# Patient Record
Sex: Male | Born: 1950 | Race: White | Hispanic: No | Marital: Married | Smoking: Never smoker
Health system: Southern US, Community
[De-identification: ages and names within clinical notes are randomized; demographics above are authoritative.]

## PROBLEM LIST (undated history)

## (undated) DIAGNOSIS — R29898 Other symptoms and signs involving the musculoskeletal system: Secondary | ICD-10-CM

## (undated) DIAGNOSIS — M4150 Other secondary scoliosis, site unspecified: Secondary | ICD-10-CM

## (undated) DIAGNOSIS — E039 Hypothyroidism, unspecified: Secondary | ICD-10-CM

## (undated) DIAGNOSIS — I10 Essential (primary) hypertension: Secondary | ICD-10-CM

## (undated) DIAGNOSIS — M2550 Pain in unspecified joint: Secondary | ICD-10-CM

## (undated) DIAGNOSIS — G629 Polyneuropathy, unspecified: Secondary | ICD-10-CM

## (undated) DIAGNOSIS — M5136 Other intervertebral disc degeneration, lumbar region: Secondary | ICD-10-CM

## (undated) DIAGNOSIS — M4180 Other forms of scoliosis, site unspecified: Secondary | ICD-10-CM

## (undated) DIAGNOSIS — I4891 Unspecified atrial fibrillation: Secondary | ICD-10-CM

## (undated) DIAGNOSIS — I499 Cardiac arrhythmia, unspecified: Secondary | ICD-10-CM

## (undated) DIAGNOSIS — M1990 Unspecified osteoarthritis, unspecified site: Secondary | ICD-10-CM

## (undated) DIAGNOSIS — M51369 Other intervertebral disc degeneration, lumbar region without mention of lumbar back pain or lower extremity pain: Secondary | ICD-10-CM

## (undated) DIAGNOSIS — M545 Low back pain, unspecified: Secondary | ICD-10-CM

## (undated) DIAGNOSIS — M5416 Radiculopathy, lumbar region: Secondary | ICD-10-CM

## (undated) DIAGNOSIS — M48062 Spinal stenosis, lumbar region with neurogenic claudication: Secondary | ICD-10-CM

## (undated) HISTORY — DX: Essential (primary) hypertension: I10

## (undated) HISTORY — DX: Pain in unspecified joint: M25.50

## (undated) HISTORY — DX: Other forms of scoliosis, site unspecified: M41.80

## (undated) HISTORY — DX: Polyneuropathy, unspecified: G62.9

## (undated) HISTORY — DX: Low back pain, unspecified: M54.50

## (undated) HISTORY — DX: Cardiac arrhythmia, unspecified: I49.9

## (undated) HISTORY — DX: Radiculopathy, lumbar region: M54.16

## (undated) HISTORY — PX: TONSILLECTOMY: SUR1361

## (undated) HISTORY — DX: Unspecified osteoarthritis, unspecified site: M19.90

## (undated) HISTORY — DX: Other intervertebral disc degeneration, lumbar region without mention of lumbar back pain or lower extremity pain: M51.369

## (undated) HISTORY — DX: Unspecified atrial fibrillation: I48.91

## (undated) HISTORY — DX: Other secondary scoliosis, site unspecified: M41.50

## (undated) HISTORY — DX: Other symptoms and signs involving the musculoskeletal system: R29.898

## (undated) HISTORY — DX: Other intervertebral disc degeneration, lumbar region: M51.36

---
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
## 2011-05-11 HISTORY — PX: FRACTURE SURGERY: SHX138

## 2012-05-10 HISTORY — PX: ANKLE SURGERY: SHX546

## 2017-05-10 HISTORY — PX: ABLATION: SHX5711

## 2018-01-16 DIAGNOSIS — M48062 Spinal stenosis, lumbar region with neurogenic claudication: Secondary | ICD-10-CM

## 2018-01-16 HISTORY — DX: Spinal stenosis, lumbar region with neurogenic claudication: M48.062

## 2018-05-02 HISTORY — PX: CARDIOVERSION: SHX1299

## 2018-11-01 ENCOUNTER — Telehealth: Payer: Self-pay

## 2018-11-01 NOTE — Telephone Encounter (Signed)
LVM to confirm Nerve conduction study

## 2018-11-02 ENCOUNTER — Encounter (INDEPENDENT_AMBULATORY_CARE_PROVIDER_SITE_OTHER): Payer: Medicare Other | Admitting: Diagnostic Neuroimaging

## 2018-11-02 ENCOUNTER — Ambulatory Visit (INDEPENDENT_AMBULATORY_CARE_PROVIDER_SITE_OTHER): Payer: Medicare Other | Admitting: Diagnostic Neuroimaging

## 2018-11-02 ENCOUNTER — Other Ambulatory Visit: Payer: Self-pay

## 2018-11-02 DIAGNOSIS — R200 Anesthesia of skin: Secondary | ICD-10-CM | POA: Diagnosis not present

## 2018-11-02 DIAGNOSIS — Z0289 Encounter for other administrative examinations: Secondary | ICD-10-CM

## 2018-11-09 NOTE — Procedures (Signed)
[facility]  NCS (NERVE CONDUCTION STUDY) WITH EMG (ELECTROMYOGRAPHY) REPORT   STUDY DATE: 11/02/18 PATIENT NAME: Cameron RoachMichael Lose DOB: 12/01/1950 MRN: [id number]  ORDERING CLINICIAN: Sheela Stack Brooks  TECHNOLOGIST: Durenda AgeN Gann ELECTROMYOGRAPHER: Glenford BayleyVikram R. Tanisia Yokley, MD  CLINICAL INFORMATION: 68 year old male with lower extremity numbness.  FINDINGS: NERVE CONDUCTION STUDY:  Bilateral peroneal and tibial motor responses of prolonged distal latencies, decreased amplitudes, slow conduction velocities.  Bilateral superficial peroneal and left sural sensory responses could not be obtained.  Right sural sensory response has normal peak latency and decreased amplitude.  Bilateral tibial F wave latencies are prolonged greater than 76 ms.  Right tibial H reflex cannot be obtained.  Left tibial H reflex is normal.   NEEDLE ELECTROMYOGRAPHY:  Needle examination of right upper and lower extremities is normal except for positive sharp waves in the right gastrocnemius at rest.  Normal motor unit recruitment on exertion.   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   - May consider further demyelinating neuropathy workup for immune neuropathies (CIDP, anti-sulfatide, anti-MAG, anti-GM1, monoclonal gammopathy), hereditary neuropathies (CMT, HMSN), infectious neuropathies (HIV) or metabolic causes (diabetes, hypothyroidism, liver disease).    INTERPRETING PHYSICIAN:  Suanne MarkerVIKRAM R. Remee Charley, MD Certified in Neurology, Neurophysiology and Neuroimaging  [facility] [address], [address] [place], [place] [address] [phone]  [facility]    Nerve / Sites Muscle Latency Ref. Amplitude Ref. Rel Amp Segments Distance Velocity Ref. Area    ms ms mV mV %  cm m/s m/s mVms  R Peroneal - EDB     Ankle EDB 6.9 ?6.5 1.4 ?2.0 100 Ankle - EDB 9   4.0     Fib head EDB 15.7  1.4   99.1 Fib head - Ankle 32 36 ?44 3.9     Pop fossa EDB 19.9  0.8  54.9 Pop fossa - Fib head 10 24 ?44 1.5         Pop fossa - Ankle      L Peroneal - EDB     Ankle EDB 6.8 ?6.5 0.8 ?2.0 100 Ankle - EDB 9   2.6     Fib head EDB 16.3  0.9  121 Fib head - Ankle 30 32 ?44 2.8     Pop fossa EDB 19.3  1.0  111 Pop fossa - Fib head 10 33 ?44 2.8         Pop fossa - Ankle      R Tibial - AH     Ankle AH 6.3 ?5.8 3.9 ?4.0 100 Ankle - AH 9   10.7     Pop fossa AH 19.2  1.9  48.6 Pop fossa - Ankle 38 30 ?41 6.6  L Tibial - AH     Ankle AH 6.3 ?5.8 2.3 ?4.0 100 Ankle - AH 9   6.0     Pop fossa AH 18.4  2.0  87.1 Pop fossa - Ankle 40 33 ?41 5.9             SNC    Nerve / Sites Rec. Site Peak Lat Ref.  Amp Ref. Segments Distance    ms ms V V  cm  R Sural - Ankle (Calf)     Calf Ankle 4.4 ?4.4 4 ?6 Calf - Ankle 14  L Sural - Ankle (Calf)     Calf Ankle NR ?4.4 NR ?6 Calf - Ankle 14  R Superficial peroneal -  Ankle     Lat leg Ankle NR ?4.4 NR ?6 Lat leg - Ankle 14  L Superficial peroneal - Ankle     Lat leg Ankle NR ?4.4 NR ?6 Lat leg - Ankle 14              F  Wave    Nerve F Lat Ref.   ms ms  R Tibial - AH 76.5 ?56.0  L Tibial - AH 77.6 ?56.0         H Reflex    Nerve H Lat   ms   Left Right Ref.  Tibial - [place] 22.1 NR ?35.0         EMG full       EMG Summary Table    Spontaneous MUAP Recruitment  Muscle IA Fib PSW Fasc Other Amp Dur. Poly Pattern  R. Tibialis anterior Increased None None None _______ Increased Normal Normal Discrete  R. Gastrocnemius (Medial head) Normal None 1+ None _______ Normal Normal Normal Normal  R. Vastus medialis Normal None None None _______ Normal Normal Normal Normal  R. Deltoid Normal None None None _______ Normal Normal Normal Normal  R. Biceps brachii Normal None None None _______ Normal Normal Normal Normal  R. Triceps brachii Normal None None None _______ Normal Normal Normal Normal  R. Flexor carpi radialis Normal None None None _______  Normal Normal Normal Normal  R. First dorsal interosseous Normal None None None _______ Normal Normal Normal Normal

## 2019-01-10 ENCOUNTER — Encounter: Payer: Self-pay | Admitting: *Deleted

## 2019-01-10 ENCOUNTER — Other Ambulatory Visit: Payer: Self-pay | Admitting: *Deleted

## 2019-01-16 ENCOUNTER — Other Ambulatory Visit: Payer: Self-pay

## 2019-01-16 ENCOUNTER — Ambulatory Visit (INDEPENDENT_AMBULATORY_CARE_PROVIDER_SITE_OTHER): Payer: Medicare Other | Admitting: Diagnostic Neuroimaging

## 2019-01-16 ENCOUNTER — Telehealth: Payer: Self-pay | Admitting: *Deleted

## 2019-01-16 ENCOUNTER — Encounter: Payer: Self-pay | Admitting: Diagnostic Neuroimaging

## 2019-01-16 VITALS — BP 124/82 | HR 68 | Temp 98.2°F | Ht 71.0 in | Wt 264.8 lb

## 2019-01-16 DIAGNOSIS — G629 Polyneuropathy, unspecified: Secondary | ICD-10-CM

## 2019-01-16 NOTE — Progress Notes (Signed)
[facility]  PATIENT: Cameron Curry DOB: August 16, 1950  REFERRING CLINICIAN: Rolena Infante HISTORY FROM: patient  REASON FOR VISIT: new consult    HISTORICAL  CHIEF COMPLAINT:  Chief Complaint  Patient presents with  . Peripheral Neuropathy    rm 6 New Pt , "numbness, tingling in toes/feet/legs, more so in back of legs"    HISTORY OF PRESENT ILLNESS:   68 year old male here for evaluation of neuropathy.  For past 2 years patient has had onset of low back pain radiating to his legs, worse when he stands up straight.  Symptoms slightly alleviated when he bends forward or sits down.  Patient's had MRI of the lumbar spine showing significant degenerative spine disease, multilevel moderate to severe spinal stenosis and foraminal stenosis.  Patient was referred here for EMG nerve conduction study which demonstrated a sensorimotor polyneuropathy with axonal and demyelinating features.  Therefore patient referred back today for further characterization of etiology of neuropathy.  Symptoms are bilateral and symmetric in the lower extremities.  His main problem is back pain radiating down his back of his legs.  He also has numbness and tingling in the toes and feet.  No problems with his fingers or hands, neck or arms.  No family history of neuropathy.   REVIEW OF SYSTEMS: Full 14 system review of systems performed and negative with exception of: As per HPI.  ALLERGIES: No Known Allergies  HOME MEDICATIONS: Outpatient Medications Prior to Visit  Medication Sig Dispense Refill  . aspirin EC 325 MG tablet Take by mouth daily. 01/16/19 taking 1/2 tab daily    . lisinopril (ZESTRIL) 10 MG tablet lisinopril 10 mg tablet  TK 1 T PO QD    . metoprolol succinate (TOPROL-XL) 50 MG 24 hr tablet metoprolol succinate ER 50 mg tablet,extended release 24 hr    . albuterol (VENTOLIN HFA) 108 (90 Base) MCG/ACT inhaler albuterol sulfate HFA 90 mcg/actuation aerosol inhaler    . amiodarone  (PACERONE) 200 MG tablet 200 mg.    . benzonatate (TESSALON) 100 MG capsule benzonatate 100 mg capsule  TAKE 1 CAPSULE BY MOUTH THREE TIMES A DAY AS NEEDED    . flecainide (TAMBOCOR) 100 MG tablet flecainide 100 mg tablet  TK 1 T PO Q 12 H    . pantoprazole (PROTONIX) 40 MG tablet pantoprazole 40 mg tablet,delayed release    . rivaroxaban (XARELTO) 20 MG TABS tablet 20 mg.    . sotalol (BETAPACE) 80 MG tablet 80 mg.     No facility-administered medications prior to visit.     PAST MEDICAL HISTORY: Past Medical History:  Diagnosis Date  . Arthritis   . Atrial fibrillation ([place])    ablation  . DDD (degenerative disc disease), lumbar   . Low back pain   . Neurogenic claudication due to lumbar spinal stenosis   . Peripheral nerve disease     PAST SURGICAL HISTORY: Past Surgical History:  Procedure Laterality Date  . ANKLE SURGERY  2014   ORIF    FAMILY HISTORY: Family History  Problem Relation Age of Onset  . Cancer Mother        ovarian  . Heart disease Father   . Atrial fibrillation Brother     SOCIAL HISTORY: Social History   Socioeconomic History  . Marital status: Married    Spouse name: Not on file  . Number of children: 0  . Years of education: Not on file  . Highest education level: Some college, no degree  Occupational History  Comment: retired  Scientific laboratory technician  . Financial resource strain: Not on file  . Food insecurity    Worry: Not on file    Inability: Not on file  . Transportation needs    Medical: Not on file    Non-medical: Not on file  Tobacco Use  . Smoking status: Never Smoker  . Smokeless tobacco: Never Used  Substance and Sexual Activity  . Alcohol use: Yes    Comment: "moderate"  . Drug use: Never  . Sexual activity: Not on file  Lifestyle  . Physical activity    Days per week: Not on file    Minutes per session: Not on file  . Stress: Not on file  Relationships  . Social Herbalist on phone: Not on file    Gets  together: Not on file    Attends religious service: Not on file    Active member of club or organization: Not on file    Attends meetings of clubs or organizations: Not on file    Relationship status: Not on file  . Intimate partner violence    Fear of current or ex partner: Not on file    Emotionally abused: Not on file    Physically abused: Not on file    Forced sexual activity: Not on file  Other Topics Concern  . Not on file  Social History Narrative   Lives with wife   Caffeine- coffee, 3 cups daily     PHYSICAL EXAM  GENERAL EXAM/CONSTITUTIONAL: Vitals:  Vitals:   01/16/19 1437  BP: 124/82  Pulse: 68  Temp: 98.2 F (36.8 C)  Weight: 264 lb 12.8 oz (120.1 kg)  Height: 5' 11"  (1.803 m)   Body mass index is 36.93 kg/m. Wt Readings from Last 3 Encounters:  01/16/19 264 lb 12.8 oz (120.1 kg)    Patient is in no distress; well developed, nourished and groomed; neck is supple  CARDIOVASCULAR:  Examination of carotid arteries is normal; no carotid bruits  Regular rate and rhythm, no murmurs  Examination of peripheral vascular system by observation and palpation is normal  EYES:  Ophthalmoscopic exam of optic discs and posterior segments is normal; no papilledema or hemorrhages No exam data present  MUSCULOSKELETAL:  Gait, strength, tone, movements noted in Neurologic exam below  NEUROLOGIC: MENTAL STATUS:  No flowsheet data found.  awake, alert, oriented to person, place and time  recent and remote memory intact  normal attention and concentration  language fluent, comprehension intact, naming intact  fund of knowledge appropriate  CRANIAL NERVE:   2nd - no papilledema on fundoscopic exam  2nd, 3rd, 4th, 6th - pupils equal and reactive to light, visual fields full to confrontation, extraocular muscles intact, no nystagmus  5th - facial sensation symmetric  7th - facial strength symmetric  8th - hearing intact  9th - palate elevates  symmetrically, uvula midline  11th - shoulder shrug symmetric  12th - tongue protrusion midline  MOTOR:   normal bulk and tone, full strength in the BUE, BLE  SENSORY:   normal and symmetric to light touch, temperature, vibration; SLIGHTLY DECR IN ANKLES  COORDINATION:   finger-nose-finger, fine finger movements normal  REFLEXES:   deep tendon reflexes --> BUE 1; BLE delayed exaggerated reaction 2-3+ in legs; inconsistent  GAIT/STATION:   narrow based gait     DIAGNOSTIC DATA (LABS, IMAGING, TESTING) - I reviewed patient records, labs, notes, testing and imaging myself where available.  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  - May consider further demyelinating neuropathy workup for immune neuropathies (CIDP, anti-sulfatide, anti-MAG, anti-GM1, monoclonal gammopathy), hereditary neuropathies (CMT, HMSN), infectious neuropathies (HIV) or metabolic causes (diabetes, hypothyroidism, liver disease).   ASSESSMENT AND PLAN  68 y.o. year old male here with: New onset numbness, pain, tingling in lower extremities, associated back pain radiating down the legs, and significant degenerative spine disease in the lumbar region.  Dx:  1. Neuropathy      PLAN:  NEUROPATHY (demyelinating features; autoimmune, inflamm, hereditary) - check labs - check LP  LUMBAR SPINAL STENOSIS (symptomatic) - follow up per Dr. Rolena Infante  Orders Placed This Encounter   Procedures  . DG FL GUIDED LUMBAR PUNCTURE  . GM1 IgG Autoantibodies  . GM1 IgM Autoantibodies  . RPR  . HIV antibody (with reflex)  . Multiple Myeloma Panel (SPEP&IFE w/QIG)  . Lyme Ab/Western Blot Reflex   Return pending test results, for pending if symptoms worsen or fail to improve.    Penni Bombard, MD 10/12/4648, 3:54 PM Certified in Neurology, Neurophysiology and Neuroimaging  [facility] [address], [place], [place] [address] [phone]

## 2019-01-16 NOTE — Telephone Encounter (Signed)
Faxed [facility] lab orders re: trsts # [id number], [phone].

## 2019-01-24 LAB — LYME AB/WESTERN BLOT REFLEX
LYME DISEASE AB, QUANT, IGM: <0.8 index (ref 0.00–0.79)
Lyme IgG/IgM Ab: 2.63 {ISR} — ABNORMAL HIGH (ref 0.00–0.90)

## 2019-01-24 LAB — MULTIPLE MYELOMA PANEL, SERUM
Albumin SerPl Elph-Mcnc: 4 g/dL (ref 2.9–4.4)
Albumin/Glob SerPl: 1.5 (ref 0.7–1.7)
Alpha 1: 0.2 g/dL (ref 0.0–0.4)
Alpha2 Glob SerPl Elph-Mcnc: 0.8 g/dL (ref 0.4–1.0)
B-Globulin SerPl Elph-Mcnc: 0.9 g/dL (ref 0.7–1.3)
Gamma Glob SerPl Elph-Mcnc: 0.9 g/dL (ref 0.4–1.8)
Globulin, Total: 2.8 g/dL (ref 2.2–3.9)
IgA/Immunoglobulin A, Serum: 281 mg/dL (ref 61–437)
IgG (Immunoglobin G), Serum: 928 mg/dL (ref 603–1613)
IgM (Immunoglobulin M), Srm: 57 mg/dL (ref 20–172)
Total Protein: 6.8 g/dL (ref 6.0–8.5)

## 2019-01-24 LAB — GM1 IGG AUTOANTIBODIES: GM1 IgG Autoantibodies: 5 % (ref 0–30)

## 2019-01-24 LAB — LYME, WESTERN BLOT, SERUM (REFLEXED)
IgG P18 Ab.: ABSENT
IgG P23 Ab.: ABSENT
IgG P28 Ab.: ABSENT
IgG P39 Ab.: ABSENT
IgG P58 Ab.: ABSENT
IgG P66 Ab.: ABSENT
IgG P93 Ab.: ABSENT
IgM P23 Ab.: ABSENT
IgM P39 Ab.: ABSENT
IgM P41 Ab.: ABSENT
Lyme IgG Wb: NEGATIVE
Lyme IgM Wb: NEGATIVE

## 2019-01-24 LAB — RPR: RPR Ser Ql: NONREACTIVE

## 2019-01-24 LAB — INTERPRETATION

## 2019-01-24 LAB — HIV ANTIBODY (ROUTINE TESTING W REFLEX): HIV Screen 4th Generation wRfx: NONREACTIVE

## 2019-01-24 LAB — GM1 IGM AUTOANTIBODIES: GM1 IgM Autoantibodies: <5 % (ref 0–30)

## 2019-01-30 NOTE — Telephone Encounter (Signed)
Received fax from [facility]: lab order is on hold. [facility] waiting on patient to sign ABN which is required by Medicare. They will cancel test if no information received in 30 days, but testing can be resumed if required information is provided. Placed on Dr [employer], Juluis Rainier.

## 2019-01-31 ENCOUNTER — Telehealth: Payer: Self-pay | Admitting: *Deleted

## 2019-01-31 NOTE — Telephone Encounter (Signed)
LVM informing patient his labs are unremarkable labs. The Lyme antibody was positive, but western blot confirmation was negative (therefore it was false positive). All other labs are ok. Continue current plan of LP. I advised I noted he knew to call to schedule LP. Left number for questions.

## 2019-01-31 NOTE — Telephone Encounter (Signed)
Follow up LP. -VRP

## 2019-04-25 ENCOUNTER — Other Ambulatory Visit: Payer: Self-pay | Admitting: *Deleted

## 2019-04-27 ENCOUNTER — Ambulatory Visit: Payer: Self-pay | Admitting: Orthopedic Surgery

## 2019-05-11 HISTORY — PX: HERNIA REPAIR: SHX51

## 2019-05-28 ENCOUNTER — Telehealth (HOSPITAL_COMMUNITY): Payer: Self-pay

## 2019-05-28 NOTE — Telephone Encounter (Signed)

## 2019-05-29 ENCOUNTER — Ambulatory Visit (INDEPENDENT_AMBULATORY_CARE_PROVIDER_SITE_OTHER): Payer: Medicare Other | Admitting: Vascular Surgery

## 2019-05-29 ENCOUNTER — Other Ambulatory Visit: Payer: Self-pay

## 2019-05-29 ENCOUNTER — Encounter: Payer: Self-pay | Admitting: Vascular Surgery

## 2019-05-29 VITALS — BP 141/85 | HR 80 | Temp 97.3°F | Resp 20 | Ht 71.0 in | Wt 268.0 lb

## 2019-05-29 DIAGNOSIS — M5136 Other intervertebral disc degeneration, lumbar region: Secondary | ICD-10-CM

## 2019-05-29 NOTE — Progress Notes (Signed)
[facility]  Patient name: Cameron Curry MRN: [id number] DOB: 02/12/1951 Sex: male  REASON FOR CONSULT: Discuss oblique exposure for L4-5 disc surgery with Dr. Shon Baton  HPI: Cameron Curry is a 69 y.o. male, who is here today for discussion of oblique exposure for L4-5 disc surgery. He is complaining of severe back pain and bilateral lower extremity weakness. He has had an extensive work-up and is felt this is due to neurogenic claudication from spinal stenosis. He is scheduled for oblique approach to L4-5 with Dr. Shon Baton. He has had no prior intra-abdominal surgery. He is very active and did a great deal of [place] fitness exercise and muscle building over his lifetime. No history of cardiac ischemic disease. Does have a history of ablation for atrial fibrillation. Does have diastases rectus  Past Medical History:  Diagnosis Date  . Arthritis   . Atrial fibrillation ([facility])    ablation  . Bilateral leg weakness   . DDD (degenerative disc disease), lumbar   . Degenerative scoliosis   . Hypertensive disorder   . Irregular heartbeat   . Joint pain   . Low back pain   . Lumbar radiculopathy   . Neurogenic claudication due to lumbar spinal stenosis 01/16/2018   Of lumbar region.  . Peripheral nerve disease     Family History  Problem Relation Age of Onset  . Cancer Mother        ovarian  . Heart disease Mother   . Heart disease Father   . Atrial fibrillation Brother     SOCIAL HISTORY: Social History   Socioeconomic History  . Marital status: Married    Spouse name: Not on file  . Number of children: 0  . Years of education: Not on file  . Highest education level: Some college, no degree  Occupational History    Comment: retired  Tobacco Use  . Smoking status: Never Smoker  . Smokeless tobacco: Never Used  Substance and Sexual Activity  . Alcohol use: Yes    Comment: "moderate"  . Drug use: Never  .  Sexual activity: Not on file  Other Topics Concern  . Not on file  Social History Narrative   Lives with wife   Caffeine- coffee, 3 cups daily   Social Determinants of Health   Financial Resource Strain:   . Difficulty of Paying Living Expenses: Not on file  Food Insecurity:   . Worried About Programme researcher, broadcasting/film/video in the Last Year: Not on file  . Ran Out of Food in the Last Year: Not on file  Transportation Needs:   . Lack of Transportation (Medical): Not on file  . Lack of Transportation (Non-Medical): Not on file  Physical Activity:   . Days of Exercise per Week: Not on file  . Minutes of Exercise per Session: Not on file  Stress:   . Feeling of Stress : Not on file  Social Connections:   . Frequency of Communication with Friends and Family: Not on file  . Frequency of Social Gatherings with Friends and Family: Not on file  . Attends Religious Services: Not on file  . Active Member of Clubs or Organizations: Not on file  . Attends Banker Meetings: Not on file  . Marital Status: Not on file  Intimate Partner Violence:   . Fear of Current or Ex-Partner: Not on file  . Emotionally Abused: Not on file  . Physically Abused: Not on file  . Sexually  Abused: Not on file    No Known Allergies  Current Outpatient Medications  Medication Sig Dispense Refill  . aspirin EC 81 MG tablet Take 81 mg by mouth daily.    Marland Kitchen levothyroxine (SYNTHROID) 25 MCG tablet levothyroxine 25 mcg tablet    . lisinopril (ZESTRIL) 10 MG tablet lisinopril 10 mg tablet  TK 1 T PO QD    . metoprolol succinate (TOPROL-XL) 50 MG 24 hr tablet metoprolol succinate ER 50 mg tablet,extended release 24 hr     No current facility-administered medications for this visit.    REVIEW OF SYSTEMS:  [X]  denotes positive finding, [ ]  denotes negative finding Cardiac  Comments:  Chest pain or chest pressure:    Shortness of breath upon exertion:    Short of breath when lying flat:    Irregular heart  rhythm:        Vascular    Pain in calf, thigh, or hip brought on by ambulation: x  neurogenic  Pain in feet at night that wakes you up from your sleep:  x   Blood clot in your veins:    Leg swelling:         Pulmonary    Oxygen at home:    Productive cough:     Wheezing:         Neurologic    Sudden weakness in arms or legs:  x   Sudden numbness in arms or legs:  x   Sudden onset of difficulty speaking or slurred speech:    Temporary loss of vision in one eye:     Problems with dizziness:         Gastrointestinal    Blood in stool:     Vomited blood:         Genitourinary    Burning when urinating:     Blood in urine:        Psychiatric    Major depression:         Hematologic    Bleeding problems:    Problems with blood clotting too easily:        Skin    Rashes or ulcers:        Constitutional    Fever or chills:      PHYSICAL EXAM: Vitals:   05/29/19 1003  BP: (!) 141/85  Pulse: 80  Resp: 20  Temp: (!) 97.3 F (36.3 C)  SpO2: 95%  Weight: 268 lb (121.6 kg)  Height: 5\' 11"  (1.803 m)    GENERAL: The patient is a well-nourished male, in no acute distress. The vital signs are documented above. CARDIOVASCULAR: Carotid arteries without bruits bilaterally. 2+ radial and 2+ dorsalis pedis pulses bilaterally. PULMONARY: There is good air exchange  ABDOMEN: Soft and non-tender  MUSCULOSKELETAL: There are no major deformities or cyanosis. NEUROLOGIC: No focal weakness or paresthesias are detected. SKIN: There are no ulcers or rashes noted. PSYCHIATRIC: The patient has a normal affect.  DATA:  I have his plain films of his lumbar spine and also MRI. His plain films show no evidence of atherosclerotic change. His MRI shows normal location of his aortic and iliac bifurcation  MEDICAL ISSUES: Had a long discussion with the patient regarding my role for exposure. Explained that Dr. Rolena Infante feels that he would benefit from spine surgery from an oblique approach.  He has multiple questions regarding the success of this procedure and outcomes. I have deferred this to Dr. Rolena Infante. I do not feel that he has any  contraindications to oblique approach. He is not morbidly obese. He is a big man. He has had no prior abdominal surgery and his films show no evidence of aortoiliac calcification. He reports that he has severe back pain and his leg weakness is caused him to fall on several occasions. We will proceed with surgery as indicated   Larina Earthly, MD [facility] Tel [phone] Pager [phone]

## 2019-06-04 ENCOUNTER — Ambulatory Visit: Payer: Self-pay | Admitting: Orthopedic Surgery

## 2019-06-04 NOTE — H&P (Signed)
Subjective:   Cameron Curry is a pleasant 69 year old male His long-standing low back pain and bilateral radicular leg pain due to spinal stenosis at multiple levels but has failed to improve with conservative treatment including time, injection therapy, medications. Since the last time we saw him on 04/20/2019, he has had many more falls due to his legs giving out on him indicating progressive neurological deficits. He has elected to move forward with surgical intervention. He is scheduled for 06/13/19 and 06/14/19= OLIF L4-5/ XLIF L2-4/ PSF L2-5 at [facility].  Patient recently had an uncomplicated course of COVID-19 and November. Since then he has seen his primary care provider. He has not seen his cardiologist since October 2020.  Past Medical History:  Diagnosis Date  . Arthritis   . Atrial fibrillation (HCC)    ablation  . Bilateral leg weakness   . DDD (degenerative disc disease), lumbar   . Degenerative scoliosis   . Hypertensive disorder   . Irregular heartbeat   . Joint pain   . Low back pain   . Lumbar radiculopathy   . Neurogenic claudication due to lumbar spinal stenosis 01/16/2018   Of lumbar region.  . Peripheral nerve disease     Past Surgical History:  Procedure Laterality Date  . ANKLE SURGERY  2014   ORIF    Current Outpatient Medications  Medication Sig Dispense Refill Last Dose  . aspirin EC 81 MG tablet Take 81 mg by mouth daily.     Marland Kitchen levothyroxine (SYNTHROID) 50 MCG tablet Take 50 mcg by mouth daily before breakfast.      . lisinopril (ZESTRIL) 10 MG tablet Take 10 mg by mouth daily.      . metoprolol succinate (TOPROL-XL) 25 MG 24 hr tablet Take 25 mg by mouth daily.      . Multiple Vitamin (MULTIVITAMIN WITH MINERALS) TABS tablet Take 1 tablet by mouth daily.      No current facility-administered medications for this visit.   No Known Allergies  Social History   Tobacco Use  . Smoking status: Never Smoker  . Smokeless tobacco: Never Used  Substance Use Topics  .  Alcohol use: Yes    Comment: "moderate"    Family History  Problem Relation Age of Onset  . Cancer Mother        ovarian  . Heart disease Mother   . Heart disease Father   . Atrial fibrillation Brother     Review of Systems As stated in HPI  Objective:   Vitals: Ht: 5 ft 10 in 06/04/2019 09:55 am Wt: 267 lbs 06/04/2019 10:05 am BMI: 38.3 06/04/2019 10:05 am BP: 188/108 06/04/2019 10:05 am Pulse: 74 bpm 06/04/2019 10:05 am T: 97.8 F 06/04/2019 10:05 am   General: Patient is alert and oriented 3, no apparent distress.  Psych: Normal mood and affect  Ambulation: Antalgic, slightly altered due to Subjective lower extremity weakness. Reports 3 falls due to leg giving out since last visit.  Heart: Regular rate and rhythm, no rubs, murmurs, or gallops  Lungs: Clear to auscultation bilaterally  Abdomen: Bowel sounds 4, nondistended, nontender, no hepatosplenomegaly.  Neuro: Continues to have 5/5 lower extremity motor strength bilaterally with the exception of bilateral gastroc weakness but seems to be secondary to pain rather than true motor deficit. Positive dysesthesias L2, L3 and to a lesser degree L4 dermatome. Negative Babinski test, no clonus.  Updated MRI of the lumbar spine performed On 04/03/2019 there has been no significant change noted. Severe disc bulging osteophyte  complex asymmetric to the left. Moderate to severe narrowing of the thecal sac which is progressed secondary to epidural lipomatosis. Moderate to severe left foraminal narrowing. L3-4: Again moderate hard disc osteophyte at the disc space level. There is a small left anterior medially directed synovial cyst and severe right facet arthrosis. Moderate to severe left lateral recess stenosis causing compression of left L4 nerve root. L4-5: Severe hard disc osteophyte asymmetric to the right with severe right disc space loss and foraminal stenosis. Chronic wedge fracture at L2. Mild retrolisthesis L1 on  L2.  Assessment:   Cameron Curry is a pleasant 69 year old male Past medical history significant for morbid obesity A. fib (on aspirin) Who is suffering from low back pain and bilateral radicular leg pain that has been rather severe, debilitating, and progressive. He is now having several falls due to his legs giving out. He would like to move forward with surgical intervention.  Plan:   Surgical plan: The first day I would do an oblique lumbar interbody fusion at L4-5 and lateral interbody fusions L2-3 and L 3 4. The second day I would do posterior supplemental pedicle screw fixation L2-L5.   We have received vascular clearance from Dr. Donnetta Hutching.   I have personally tried to contact The primary care provider as the patient is quite hypertensive at today's visit. I have left a callback number of the provider to call me. Patient states he has taken his medication for blood pressure and that it has been well managed at home. I have informed the patient that he must see his primary care provider for reevaluation and blood pressure check prior to moving forward with surgical intervention. Patient states he has seen this provider to times since being diagnosed with COVID.   I have personally called and spoken with Dr. Christel Mormon the patient's cardiologist who states he was unaware that the patient needed cardio clearance/risk stratification (which we do NOT have), he thought the question was just iadvice as to what to do for his blood thinner medication. Documentation we have from Dr. Christel Mormon previously said the patient may hold his Xarelto 48 hours preoperatively, we then responded by asking if it was okay to hold the Xarelto 5 days preoperatively at which point it was discovered the patient is not taking Xarelto, but that he is taking aspirin 325 mg. It does appear that the cardiologist is aware That the patient is not taking Xarelto, although it appears xarelto is/was the recommendation. Additionally, patient is taking  aspirin, Per cardiologist he should be on 325 mg daily, however patient reports taking 81 mg daily. I did inform the cardiologist that we can fax any surgical clearance on 04/25/2019, but he states that his staff must of interpreted this and not made him aware, but rather just asked him about the blood in her medication. I informed Dr. Christel Mormon that the primary care Provider indicated on their clearance form that the patient needs cardiac clearance; additionally, we would prefer that patient have cardiac clearance as well. Therefore, Dr. Christel Mormon states he will try and get the patient in for a cardiac evaluation and risk stratification/clearance prior to his scheduled surgery next week. Patient's last visit with the cardiologist was in October 2020, before being diagnosed with COVID. Prior to moving forward with surgical intervention, the patient must be seen and evaluated by cardiologist and deemed an acceptable risk and optimized for surgery.   After reaching out to both the patient's primary care provider as well as his cardiologist, I attempted  to contact the patient and left a message on his call back so that I could inform him that he must be seen and evaluated by his primary care, who had not been able to get a hold of, as well as his cardiologist whose office should be contacting him to schedule an appointment.   We did discuss risks of surgery including:   Risks and benefits of surgery were discussed with the patient. These include: Infection, bleeding, death, stroke, paralysis, ongoing or worse pain, need for additional surgery, nonunion, leak of spinal fluid, adjacent segment degeneration requiring additional fusion surgery, need for posterior decompression and/or fusion. Bleeding from major vessels, and blood clots (deep venous thrombosis)requiring additional treatment. Due to the abdominal contents requiring further intervention, loss in bowel and bladder control.  Additional risk for male  patients: Retrograde ejaculation, and therefore infertility.   Risks, benefits of surgery were reviewed with the patient. These include: infection, bleeding, death, stroke, paralysis, ongoing or worse pain, need for additional surgery, injury to the lumbar plexus resulting in hip flexor weakness and difficulty walking without assistive devices. Adjacent segment degenerative disease, need for additional surgery including fusing other levels, leak of spinal fluid, Nonunion, hardware failure, breakage, or mal-position. Deep venous thrombosis (DVT) requiring additional treatment such as filter, and/or medications. Injury to abdominal contents, loss in bowel and bladder control.   We have also discussed the goals of surgery to include:   Goals of surgery: Reduction in pain, and improvement in quality of life.   We have also discussed the post-operative recovery period to include: bathing/showering restrictions, wound healing, activity (and driving) restrictions, medications/pain mangement.   We have also discussed post-operative redflags to include: signs and symptoms of postoperative infection, DVT/PE.   I did review the patient's medication list with him today. He is taking aspirin 81 mg which he knows he needs to hold 7 days prior to surgical intervention. I also informed him that he should not take any anti-inflammatory medications leading up to surgery which he did express understanding of.   Patient is scheduled for preoperative testing at [facility] which will include a chest x-ray. Given current Covid protocol, he should not be retested as a positive test within the last 3 months.   Patient was fitted for his LSO brace by physical therapy today.   Plan is to move forward with the surgery as scheduled if the patient is able to get in with his PCP as well as cardiologist and cleared as well as pending preoperative testing at [facility].   Follow-up: 2 weeks  postoperatively

## 2019-06-08 ENCOUNTER — Encounter (HOSPITAL_COMMUNITY): Payer: Self-pay

## 2019-06-08 NOTE — Progress Notes (Signed)
[facility] [phone] - [place], [place] - [address] AT [address] [place] [address] Phone: [phone] Fax: [phone]      Your procedure is scheduled on Wednesday, February 3rd, 2021.  Report to [facility] Main Entrance "A" at 6:30 A.M., and check in at the Admitting office.  Call this number if you have problems the morning of surgery:  [phone]  Call [phone] if you have any questions prior to your surgery date Monday-Friday 8am-4pm    Remember:  Do not eat or drink after midnight the night before your surgery   Take these medicines the morning of surgery with A SIP OF WATER :  Levothyroxine (Synthroid) Metoprolol Succinate (Toprol-XL)  From this point forward, STOP taking any Aspirin (unless otherwise instructed by your surgeon), Aleve, Naproxen, Ibuprofen, Motrin, Advil, Goody's, BC's, all herbal medications, fish oil, and all vitamins.    The Morning of Surgery  Do not wear jewelry.  Do not wear lotions, powders, or perfumes/colognes, or deodorant  Do not shave 48 hours prior to surgery.  Men may shave face and neck.  Do not bring valuables to the hospital.  [facility] is not responsible for any belongings or valuables.  If you are a smoker, DO NOT Smoke 24 hours prior to surgery  If you wear a CPAP at night please bring your mask the morning of surgery   Remember that you must have someone to transport you home after your surgery, and remain with you for 24 hours if you are discharged the same day.   Please bring cases for contacts, glasses, hearing aids, dentures or bridgework because it cannot be worn into surgery.    Leave your suitcase in the car.  After surgery it may be brought to your room.  For patients admitted to the hospital, discharge time will be determined by your treatment team.  Patients discharged the day of surgery will not be allowed to drive home.    Special instructions:   [facility]- Preparing For Surgery  Before surgery, you can play an important role. Because skin is not sterile, your skin needs to be as free of germs as possible. You can reduce the number of germs on your skin by washing with CHG (chlorahexidine gluconate) Soap before surgery.  CHG is an antiseptic cleaner which kills germs and bonds with the skin to continue killing germs even after washing.    Oral Hygiene is also important to reduce your risk of infection.  Remember - BRUSH YOUR TEETH THE MORNING OF SURGERY WITH YOUR REGULAR TOOTHPASTE  Please do not use if you have an allergy to CHG or antibacterial soaps. If your skin becomes reddened/irritated stop using the CHG.  Do not shave (including legs and underarms) for at least 48 hours prior to first CHG shower. It is OK to shave your face.  Please follow these instructions carefully.   1. Shower the NIGHT BEFORE SURGERY and the MORNING OF SURGERY with CHG Soap.   2. If you chose to wash your hair, wash your hair first as usual with your normal shampoo.  3. After you shampoo, rinse your hair and body thoroughly to remove the shampoo.  4. Use CHG as you would any other liquid soap. You can apply CHG directly to the skin and wash gently with a scrungie or a clean washcloth.   5. Apply the CHG Soap to your body ONLY FROM THE NECK DOWN.  Do not  use on open wounds or open sores. Avoid contact with your eyes, ears, mouth and genitals (private parts). Wash Face and genitals (private parts)  with your normal soap.   6. Wash thoroughly, paying special attention to the area where your surgery will be performed.  7. Thoroughly rinse your body with warm water from the neck down.  8. DO NOT shower/wash with your normal soap after using and rinsing off the CHG Soap.  9. Pat yourself dry with a CLEAN TOWEL.  10. Wear CLEAN PAJAMAS to bed the night before surgery, wear comfortable clothes the morning of surgery  11. Place CLEAN SHEETS on your bed the  night of your first shower and DO NOT SLEEP WITH PETS.    Day of Surgery:  Please shower the morning of surgery with the CHG soap Do not apply any deodorants/lotions. Please wear clean clothes to the hospital/surgery center.   Remember to brush your teeth WITH YOUR REGULAR TOOTHPASTE.   Please read over the following fact sheets that you were given.

## 2019-06-11 ENCOUNTER — Encounter (HOSPITAL_COMMUNITY)
Admission: RE | Admit: 2019-06-11 | Discharge: 2019-06-11 | Disposition: A | Discharge status: Home / Self Care | Payer: Medicare Other | Source: Ambulatory Visit | Attending: Orthopedic Surgery | Admitting: Orthopedic Surgery

## 2019-06-11 ENCOUNTER — Encounter (HOSPITAL_COMMUNITY): Payer: Self-pay

## 2019-06-11 ENCOUNTER — Other Ambulatory Visit: Payer: Self-pay

## 2019-06-11 ENCOUNTER — Ambulatory Visit (HOSPITAL_COMMUNITY)
Admission: RE | Admit: 2019-06-11 | Discharge: 2019-06-11 | Disposition: A | Discharge status: Home / Self Care | Payer: Medicare Other | Source: Ambulatory Visit | Attending: Orthopedic Surgery | Admitting: Orthopedic Surgery

## 2019-06-11 DIAGNOSIS — Z01818 Encounter for other preprocedural examination: Secondary | ICD-10-CM

## 2019-06-11 HISTORY — DX: Hypothyroidism, unspecified: E03.9

## 2019-06-11 HISTORY — DX: Cardiac arrhythmia, unspecified: I49.9

## 2019-06-11 LAB — URINALYSIS, ROUTINE W REFLEX MICROSCOPIC
Bilirubin Urine: NEGATIVE
Glucose, UA: NEGATIVE mg/dL
Hgb urine dipstick: NEGATIVE
Ketones, ur: NEGATIVE mg/dL
Leukocytes,Ua: NEGATIVE
Nitrite: NEGATIVE
Protein, ur: NEGATIVE mg/dL
Specific Gravity, Urine: 1.02 (ref 1.005–1.030)
pH: 6 (ref 5.0–8.0)

## 2019-06-11 LAB — BASIC METABOLIC PANEL
Anion gap: 10 (ref 5–15)
BUN: 21 mg/dL (ref 8–23)
CO2: 23 mmol/L (ref 22–32)
Calcium: 9 mg/dL (ref 8.9–10.3)
Chloride: 107 mmol/L (ref 98–111)
Creatinine, Ser: 1.18 mg/dL (ref 0.61–1.24)
GFR calc Af Amer: >60 mL/min (ref >60)
GFR calc non Af Amer: >60 mL/min (ref >60)
Glucose, Bld: 94 mg/dL (ref 70–99)
Potassium: 4.3 mmol/L (ref 3.5–5.1)
Sodium: 140 mmol/L (ref 135–145)

## 2019-06-11 LAB — TYPE AND SCREEN
ABO/RH(D): A POS
Antibody Screen: NEGATIVE

## 2019-06-11 LAB — APTT: aPTT: 29 seconds (ref 24–36)

## 2019-06-11 LAB — CBC
HCT: 50.5 % (ref 39.0–52.0)
Hemoglobin: 17.3 g/dL — ABNORMAL HIGH (ref 13.0–17.0)
MCH: 33.5 pg (ref 26.0–34.0)
MCHC: 34.3 g/dL (ref 30.0–36.0)
MCV: 97.7 fL (ref 80.0–100.0)
Platelets: 178 10*3/uL (ref 150–400)
RBC: 5.17 MIL/uL (ref 4.22–5.81)
RDW: 11.9 % (ref 11.5–15.5)
WBC: 7.5 10*3/uL (ref 4.0–10.5)
nRBC: 0 % (ref 0.0–0.2)

## 2019-06-11 LAB — ABO/RH: ABO/RH(D): A POS

## 2019-06-11 LAB — SURGICAL PCR SCREEN
MRSA, PCR: NEGATIVE
Staphylococcus aureus: NEGATIVE

## 2019-06-11 LAB — PROTIME-INR
INR: 1.1 (ref 0.8–1.2)
Prothrombin Time: 13.8 seconds (ref 11.4–15.2)

## 2019-06-11 NOTE — Progress Notes (Signed)
PCP - Arma Heading, MD Cardiologist - Erich Montane, II MD  Chest x-ray - 06/11/19 EKG - 06/07/19 Stress Test - denies ECHO - 05/02/2018 Cardiac Cath - denies  Blood Thinner Instructions: N/A Aspirin Instructions: N/A  COVID TEST- Patient tested positive 03/13/2019; paperwork is in hard chart; told he did not have to re-test; aware of need to self-quarantine  Coronavirus Screening  Have you experienced the following symptoms:  Cough yes/no: No Fever (>100.2F)  yes/no: No Runny nose yes/no: No Sore throat yes/no: No Difficulty breathing/shortness of breath  yes/no: No  Have you or a family member traveled in the last 14 days and where? yes/no: No  If the patient indicates "YES" to the above questions, their PAT will be rescheduled to limit the exposure to others and, the surgeon will be notified. THE PATIENT WILL NEED TO BE ASYMPTOMATIC FOR 14 DAYS.   If the patient is not experiencing any of these symptoms, the PAT nurse will instruct them to NOT bring anyone with them to their appointment since they may have these symptoms or traveled as well.   Please remind your patients and families that hospital visitation restrictions are in effect and the importance of the restrictions.   Anesthesia review: Yes; hx atrial fibrillation; cardioversion 05/03/19; cardiac clearance 06/07/19 - requested EKG tracing from [facility]  Patient denies shortness of breath, fever, cough and chest pain at PAT appointment  All instructions explained to the patient, with a verbal understanding of the material. Patient agrees to go over the instructions while at home for a better understanding. Patient also instructed to self quarantine after being tested for COVID-19. The opportunity to ask questions was provided.

## 2019-06-12 MED ORDER — DEXTROSE 5 % IV SOLN
3.0000 g | INTRAVENOUS | Status: AC
  Administered 2019-06-13 (×2): 3 g via INTRAVENOUS
  Filled 2019-06-12: qty 3
  Filled 2019-06-12 (×2): qty 3000

## 2019-06-12 MED ORDER — DEXTROSE 5 % IV SOLN
3.0000 g | INTRAVENOUS | Status: DC
  Filled 2019-06-12 (×2): qty 3000

## 2019-06-12 NOTE — Anesthesia Preprocedure Evaluation (Addendum)
Anesthesia Evaluation  Patient identified by MRN, date of birth, ID band Patient awake    Reviewed: Allergy & Precautions, NPO status   Airway Mallampati: II  TM Distance: >3 FB     Dental   Pulmonary    breath sounds clear to auscultation       Cardiovascular hypertension, + dysrhythmias  Rhythm:Regular Rate:Normal     Neuro/Psych    GI/Hepatic negative GI ROS, Neg liver ROS,   Endo/Other  negative endocrine ROSHypothyroidism   Renal/GU negative Renal ROS     Musculoskeletal   Abdominal   Peds  Hematology   Anesthesia Other Findings   Reproductive/Obstetrics                           Anesthesia Physical Anesthesia Plan  ASA: III  Anesthesia Plan: General   Post-op Pain Management:    Induction: Intravenous  PONV Risk Score and Plan: 2 and Ondansetron, Midazolam and Dexamethasone  Airway Management Planned: Oral ETT  Additional Equipment: Arterial line  Intra-op Plan:   Post-operative Plan: Possible Post-op intubation/ventilation  Informed Consent:     Dental advisory given  Plan Discussed with: CRNA and Anesthesiologist  Anesthesia Plan Comments: (Follows with cardiologist Dr. Nicanor Bake for history of afib s/p ablation. Cardiac clearance per OV note 06/07/19, "Preoperative risk assessment: he has a history of atrial fibrillation, which has been well-controlled after ablation. He has no further cardiac risk factors. He can engage in >4 METs of activity without limitation. He is at low risk of a major adverse cardiac event in the perioperative period for a high-risk procedure. No further testing is advised. Would continue beta blockade through procedure."  Preop labs reviewed, WNL.   EKG 06/07/19 (copy on chart): Sinus rhythm, rate 68.   TEE 05/02/18 (care everywhere): The left atrium is moderately dilated. The left ventricle is normal in size. The left ventricular ejection  fraction is normal (55-60%). The right atrium is mildly dilated. Mild aortic sclerosis is present with good valvular opening. There is mild mitral regurgitation (1+). There is no thrombus seen in the appendage.)      Anesthesia Quick Evaluation

## 2019-06-12 NOTE — Progress Notes (Signed)
Anesthesia Chart Review:  Follows with cardiologist Dr. Nicanor Bake for history of afib s/p ablation. Cardiac clearance per OV note 06/07/19, "Preoperative risk assessment: he has a history of atrial fibrillation, which has been well-controlled after ablation. He has no further cardiac risk factors. He can engage in >4 METs of activity without limitation. He is at low risk of a major adverse cardiac event in the perioperative period for a high-risk procedure. No further testing is advised. Would continue beta blockade through procedure."  Preop labs reviewed, WNL.   EKG 06/07/19 (copy on chart): Sinus rhythm, rate 68.   TEE 05/02/18 (care everywhere): The left atrium is moderately dilated. The left ventricle is normal in size. The left ventricular ejection fraction is normal (55-60%). The right atrium is mildly dilated. Mild aortic sclerosis is present with good valvular opening. There is mild mitral regurgitation (1+). There is no thrombus seen in the appendage.   [facility] Short Stay Center/Anesthesiology Phone [phone] 06/12/2019 10:15 AM

## 2019-06-13 ENCOUNTER — Inpatient Hospital Stay (HOSPITAL_COMMUNITY): Payer: Medicare Other

## 2019-06-13 ENCOUNTER — Inpatient Hospital Stay (HOSPITAL_COMMUNITY)
Admission: RE | Admit: 2019-06-13 | Discharge: 2019-06-17 | DRG: 458 | Disposition: A | Discharge status: Home Health | Payer: Medicare Other | Attending: Orthopedic Surgery | Admitting: Orthopedic Surgery

## 2019-06-13 ENCOUNTER — Inpatient Hospital Stay (HOSPITAL_COMMUNITY): Payer: Medicare Other | Admitting: Physician Assistant

## 2019-06-13 ENCOUNTER — Encounter (HOSPITAL_COMMUNITY): Payer: Self-pay | Admitting: Orthopedic Surgery

## 2019-06-13 ENCOUNTER — Other Ambulatory Visit: Payer: Self-pay

## 2019-06-13 ENCOUNTER — Inpatient Hospital Stay (HOSPITAL_COMMUNITY)
Admission: RE | Disposition: A | Discharge status: Home / Self Care | Payer: Self-pay | Source: Home / Self Care | Attending: Orthopedic Surgery

## 2019-06-13 ENCOUNTER — Inpatient Hospital Stay (HOSPITAL_COMMUNITY): Payer: Medicare Other | Admitting: Anesthesiology

## 2019-06-13 DIAGNOSIS — M48062 Spinal stenosis, lumbar region with neurogenic claudication: Secondary | ICD-10-CM | POA: Diagnosis present

## 2019-06-13 DIAGNOSIS — M4186 Other forms of scoliosis, lumbar region: Principal | ICD-10-CM | POA: Diagnosis present

## 2019-06-13 DIAGNOSIS — M4326 Fusion of spine, lumbar region: Secondary | ICD-10-CM | POA: Diagnosis present

## 2019-06-13 DIAGNOSIS — M5136 Other intervertebral disc degeneration, lumbar region: Secondary | ICD-10-CM

## 2019-06-13 DIAGNOSIS — Z419 Encounter for procedure for purposes other than remedying health state, unspecified: Secondary | ICD-10-CM

## 2019-06-13 DIAGNOSIS — R296 Repeated falls: Secondary | ICD-10-CM | POA: Diagnosis present

## 2019-06-13 DIAGNOSIS — Z9181 History of falling: Secondary | ICD-10-CM | POA: Diagnosis not present

## 2019-06-13 DIAGNOSIS — Z20822 Contact with and (suspected) exposure to covid-19: Secondary | ICD-10-CM | POA: Diagnosis present

## 2019-06-13 DIAGNOSIS — I10 Essential (primary) hypertension: Secondary | ICD-10-CM | POA: Diagnosis present

## 2019-06-13 DIAGNOSIS — M5116 Intervertebral disc disorders with radiculopathy, lumbar region: Secondary | ICD-10-CM | POA: Diagnosis present

## 2019-06-13 DIAGNOSIS — Z981 Arthrodesis status: Secondary | ICD-10-CM

## 2019-06-13 HISTORY — PX: ANTERIOR LAT LUMBAR FUSION: SHX1168

## 2019-06-13 HISTORY — PX: ABDOMINAL EXPOSURE: SHX5708

## 2019-06-13 HISTORY — PX: ANTERIOR LUMBAR FUSION: SHX1170

## 2019-06-13 LAB — RESPIRATORY PANEL BY RT PCR (FLU A&B, COVID)
Influenza A by PCR: NEGATIVE
Influenza B by PCR: NEGATIVE
SARS Coronavirus 2 by RT PCR: NEGATIVE

## 2019-06-13 SURGERY — ANTERIOR LUMBAR FUSION 1 LEVEL
Anesthesia: General

## 2019-06-13 MED ORDER — MIDAZOLAM HCL 2 MG/2ML IJ SOLN
INTRAMUSCULAR | Status: AC
  Filled 2019-06-13: qty 2

## 2019-06-13 MED ORDER — EPINEPHRINE PF 1 MG/ML IJ SOLN
INTRAMUSCULAR | Status: AC
  Filled 2019-06-13: qty 1

## 2019-06-13 MED ORDER — SODIUM CHLORIDE 0.9% FLUSH
3.0000 mL | Freq: Two times a day (BID) | INTRAVENOUS | Status: DC
  Administered 2019-06-13: 21:00:00 3 mL via INTRAVENOUS

## 2019-06-13 MED ORDER — FENTANYL CITRATE (PF) 250 MCG/5ML IJ SOLN
INTRAMUSCULAR | Status: AC
  Filled 2019-06-13: qty 5

## 2019-06-13 MED ORDER — EPHEDRINE SULFATE 50 MG/ML IJ SOLN
INTRAMUSCULAR | Status: DC | PRN
  Administered 2019-06-13: 10 mg via INTRAVENOUS
  Administered 2019-06-13: 5 mg via INTRAVENOUS

## 2019-06-13 MED ORDER — THROMBIN 20000 UNITS EX SOLR
CUTANEOUS | Status: DC | PRN
  Administered 2019-06-13: 20 mL

## 2019-06-13 MED ORDER — LACTATED RINGERS IV SOLN: INTRAVENOUS | Status: DC | PRN

## 2019-06-13 MED ORDER — PROPOFOL 10 MG/ML IV BOLUS
INTRAVENOUS | Status: DC | PRN
  Administered 2019-06-13: 200 mg via INTRAVENOUS

## 2019-06-13 MED ORDER — POLYETHYLENE GLYCOL 3350 17 G PO PACK: 17.0000 g | PACK | Freq: Every day | ORAL | Status: DC | PRN

## 2019-06-13 MED ORDER — PROPOFOL 10 MG/ML IV BOLUS
INTRAVENOUS | Status: AC
  Filled 2019-06-13: qty 40

## 2019-06-13 MED ORDER — SUCCINYLCHOLINE CHLORIDE 200 MG/10ML IV SOSY
PREFILLED_SYRINGE | INTRAVENOUS | Status: AC
  Filled 2019-06-13: qty 10

## 2019-06-13 MED ORDER — PHENYLEPHRINE HCL (PRESSORS) 10 MG/ML IV SOLN
INTRAVENOUS | Status: DC | PRN
  Administered 2019-06-13 (×2): 120 ug via INTRAVENOUS
  Administered 2019-06-13 (×2): 80 ug via INTRAVENOUS
  Administered 2019-06-13: 120 ug via INTRAVENOUS

## 2019-06-13 MED ORDER — ONDANSETRON HCL 4 MG/2ML IJ SOLN: 4.0000 mg | Freq: Four times a day (QID) | INTRAMUSCULAR | Status: DC | PRN

## 2019-06-13 MED ORDER — THROMBIN (RECOMBINANT) 20000 UNITS EX SOLR
CUTANEOUS | Status: AC
  Filled 2019-06-13: qty 20000

## 2019-06-13 MED ORDER — ACETAMINOPHEN 325 MG PO TABS
650.0000 mg | ORAL_TABLET | ORAL | Status: DC | PRN
  Administered 2019-06-14: 650 mg via ORAL
  Filled 2019-06-13: qty 2

## 2019-06-13 MED ORDER — CALCIUM CHLORIDE 10 % IV SOLN
INTRAVENOUS | Status: DC | PRN
  Administered 2019-06-13 (×2): 50 mg via INTRAVENOUS

## 2019-06-13 MED ORDER — BUPIVACAINE HCL (PF) 0.25 % IJ SOLN
INTRAMUSCULAR | Status: AC
  Filled 2019-06-13: qty 30

## 2019-06-13 MED ORDER — ALBUMIN HUMAN 5 % IV SOLN: INTRAVENOUS | Status: DC | PRN

## 2019-06-13 MED ORDER — HEPARIN SODIUM (PORCINE) 1000 UNIT/ML IJ SOLN
INTRAMUSCULAR | Status: DC | PRN
  Administered 2019-06-13: 10000 [IU] via INTRAVENOUS

## 2019-06-13 MED ORDER — LISINOPRIL 10 MG PO TABS
10.0000 mg | ORAL_TABLET | Freq: Every day | ORAL | Status: DC
  Administered 2019-06-14: 13:00:00 10 mg via ORAL
  Filled 2019-06-13: qty 1

## 2019-06-13 MED ORDER — DEXAMETHASONE SODIUM PHOSPHATE 10 MG/ML IJ SOLN
INTRAMUSCULAR | Status: AC
  Filled 2019-06-13: qty 1

## 2019-06-13 MED ORDER — CEFAZOLIN SODIUM 1 G IJ SOLR
INTRAMUSCULAR | Status: AC
  Filled 2019-06-13: qty 30

## 2019-06-13 MED ORDER — HEPARIN SODIUM (PORCINE) 1000 UNIT/ML IJ SOLN
INTRAMUSCULAR | Status: AC
  Filled 2019-06-13: qty 1

## 2019-06-13 MED ORDER — LIDOCAINE 2% (20 MG/ML) 5 ML SYRINGE
INTRAMUSCULAR | Status: AC
  Filled 2019-06-13: qty 5

## 2019-06-13 MED ORDER — 0.9 % SODIUM CHLORIDE (POUR BTL) OPTIME
TOPICAL | Status: DC | PRN
  Administered 2019-06-13 (×3): 1000 mL

## 2019-06-13 MED ORDER — DOCUSATE SODIUM 100 MG PO CAPS
100.0000 mg | ORAL_CAPSULE | Freq: Two times a day (BID) | ORAL | Status: DC
  Administered 2019-06-13 – 2019-06-14 (×2): 100 mg via ORAL
  Filled 2019-06-13 (×2): qty 1

## 2019-06-13 MED ORDER — EPHEDRINE 5 MG/ML INJ
INTRAVENOUS | Status: AC
  Filled 2019-06-13: qty 10

## 2019-06-13 MED ORDER — HEMOSTATIC AGENTS (NO CHARGE) OPTIME
TOPICAL | Status: DC | PRN
  Administered 2019-06-13 (×4): 1

## 2019-06-13 MED ORDER — METHOCARBAMOL 500 MG PO TABS
500.0000 mg | ORAL_TABLET | Freq: Four times a day (QID) | ORAL | Status: DC | PRN
  Administered 2019-06-13: 500 mg via ORAL
  Filled 2019-06-13: qty 1

## 2019-06-13 MED ORDER — TRANEXAMIC ACID-NACL 1000-0.7 MG/100ML-% IV SOLN
INTRAVENOUS | Status: DC | PRN
  Administered 2019-06-13: 1000 mg via INTRAVENOUS

## 2019-06-13 MED ORDER — CEFAZOLIN SODIUM-DEXTROSE 1-4 GM/50ML-% IV SOLN
1.0000 g | Freq: Three times a day (TID) | INTRAVENOUS | Status: AC
  Administered 2019-06-13 (×2): 1 g via INTRAVENOUS
  Filled 2019-06-13: qty 50

## 2019-06-13 MED ORDER — ACETAMINOPHEN 10 MG/ML IV SOLN
INTRAVENOUS | Status: DC | PRN
  Administered 2019-06-13: 1000 mg via INTRAVENOUS

## 2019-06-13 MED ORDER — MORPHINE SULFATE (PF) 2 MG/ML IV SOLN
2.0000 mg | INTRAVENOUS | Status: DC | PRN
  Administered 2019-06-13 (×2): 2 mg via INTRAVENOUS
  Filled 2019-06-13 (×2): qty 1

## 2019-06-13 MED ORDER — ENSURE PRE-SURGERY PO LIQD
296.0000 mL | Freq: Once | ORAL | Status: AC
  Administered 2019-06-14: 03:00:00 296 mL via ORAL
  Filled 2019-06-13: qty 296

## 2019-06-13 MED ORDER — TRANEXAMIC ACID-NACL 1000-0.7 MG/100ML-% IV SOLN
INTRAVENOUS | Status: AC
  Filled 2019-06-13: qty 100

## 2019-06-13 MED ORDER — ONDANSETRON HCL 4 MG/2ML IJ SOLN
INTRAMUSCULAR | Status: DC | PRN
  Administered 2019-06-13: 4 mg via INTRAVENOUS

## 2019-06-13 MED ORDER — DEXAMETHASONE SODIUM PHOSPHATE 10 MG/ML IJ SOLN
INTRAMUSCULAR | Status: DC | PRN
  Administered 2019-06-13: 10 mg via INTRAVENOUS

## 2019-06-13 MED ORDER — HYDROMORPHONE HCL 1 MG/ML IJ SOLN
INTRAMUSCULAR | Status: AC
  Filled 2019-06-13: qty 1

## 2019-06-13 MED ORDER — LIDOCAINE 2% (20 MG/ML) 5 ML SYRINGE
INTRAMUSCULAR | Status: DC | PRN
  Administered 2019-06-13: 100 mg via INTRAVENOUS

## 2019-06-13 MED ORDER — OXYCODONE HCL 5 MG PO TABS
10.0000 mg | ORAL_TABLET | ORAL | Status: DC | PRN
  Administered 2019-06-13 – 2019-06-14 (×4): 10 mg via ORAL
  Filled 2019-06-13 (×4): qty 2

## 2019-06-13 MED ORDER — OXYCODONE HCL 5 MG PO TABS: 5.0000 mg | ORAL_TABLET | ORAL | Status: DC | PRN

## 2019-06-13 MED ORDER — DEXAMETHASONE SODIUM PHOSPHATE 4 MG/ML IJ SOLN
4.0000 mg | Freq: Four times a day (QID) | INTRAMUSCULAR | Status: DC
  Administered 2019-06-13 – 2019-06-14 (×3): 4 mg via INTRAVENOUS
  Filled 2019-06-13 (×3): qty 1

## 2019-06-13 MED ORDER — ONDANSETRON HCL 4 MG PO TABS: 4.0000 mg | ORAL_TABLET | Freq: Four times a day (QID) | ORAL | Status: DC | PRN

## 2019-06-13 MED ORDER — ACETAMINOPHEN 650 MG RE SUPP: 650.0000 mg | RECTAL | Status: DC | PRN

## 2019-06-13 MED ORDER — CHLORHEXIDINE GLUCONATE 4 % EX LIQD: 60.0000 mL | Freq: Once | CUTANEOUS | Status: DC

## 2019-06-13 MED ORDER — MIDAZOLAM HCL 5 MG/5ML IJ SOLN
INTRAMUSCULAR | Status: DC | PRN
  Administered 2019-06-13: 2 mg via INTRAVENOUS

## 2019-06-13 MED ORDER — ONDANSETRON HCL 4 MG/2ML IJ SOLN
INTRAMUSCULAR | Status: AC
  Filled 2019-06-13: qty 2

## 2019-06-13 MED ORDER — PHENYLEPHRINE HCL-NACL 10-0.9 MG/250ML-% IV SOLN
INTRAVENOUS | Status: DC | PRN
  Administered 2019-06-13: 25 ug/min via INTRAVENOUS

## 2019-06-13 MED ORDER — ACETAMINOPHEN 10 MG/ML IV SOLN
INTRAVENOUS | Status: AC
  Filled 2019-06-13: qty 100

## 2019-06-13 MED ORDER — MAGNESIUM CITRATE PO SOLN: 1.0000 | Freq: Once | ORAL | Status: DC | PRN

## 2019-06-13 MED ORDER — DEXAMETHASONE 4 MG PO TABS
4.0000 mg | ORAL_TABLET | Freq: Four times a day (QID) | ORAL | Status: DC
  Administered 2019-06-14: 03:00:00 4 mg via ORAL
  Filled 2019-06-13: qty 1

## 2019-06-13 MED ORDER — METOPROLOL SUCCINATE ER 25 MG PO TB24
25.0000 mg | ORAL_TABLET | Freq: Every day | ORAL | Status: DC
  Administered 2019-06-14 (×2): 25 mg via ORAL
  Filled 2019-06-13 (×2): qty 1

## 2019-06-13 MED ORDER — ACD FORMULA A 0.73-2.45-2.2 GM/100ML VI SOLN
Status: AC | PRN
  Administered 2019-06-13: 12 mL

## 2019-06-13 MED ORDER — MENTHOL 3 MG MT LOZG: 1.0000 | LOZENGE | OROMUCOSAL | Status: DC | PRN

## 2019-06-13 MED ORDER — HYDROMORPHONE HCL 1 MG/ML IJ SOLN
0.2500 mg | INTRAMUSCULAR | Status: DC | PRN
  Administered 2019-06-13 (×3): 0.5 mg via INTRAVENOUS

## 2019-06-13 MED ORDER — SODIUM CHLORIDE 0.9 % IV SOLN: 250.0000 mL | INTRAVENOUS | Status: DC

## 2019-06-13 MED ORDER — LACTATED RINGERS IV SOLN: INTRAVENOUS | Status: DC

## 2019-06-13 MED ORDER — PROPOFOL 500 MG/50ML IV EMUL
INTRAVENOUS | Status: DC | PRN
  Administered 2019-06-13: 25 ug/kg/min via INTRAVENOUS

## 2019-06-13 MED ORDER — PHENOL 1.4 % MT LIQD: 1.0000 | OROMUCOSAL | Status: DC | PRN

## 2019-06-13 MED ORDER — LEVOTHYROXINE SODIUM 25 MCG PO TABS
50.0000 ug | ORAL_TABLET | Freq: Every day | ORAL | Status: DC
  Administered 2019-06-14: 03:00:00 50 ug via ORAL
  Filled 2019-06-13: qty 2
  Filled 2019-06-13: qty 1

## 2019-06-13 MED ORDER — SODIUM CHLORIDE 0.9% FLUSH: 3.0000 mL | INTRAVENOUS | Status: DC | PRN

## 2019-06-13 MED ORDER — METHOCARBAMOL 1000 MG/10ML IJ SOLN
500.0000 mg | Freq: Four times a day (QID) | INTRAVENOUS | Status: DC | PRN
  Filled 2019-06-13: qty 5

## 2019-06-13 MED ORDER — GABAPENTIN 300 MG PO CAPS
300.0000 mg | ORAL_CAPSULE | Freq: Three times a day (TID) | ORAL | Status: DC
  Administered 2019-06-13: 21:00:00 300 mg via ORAL
  Filled 2019-06-13: qty 1

## 2019-06-13 MED ORDER — DEXTROSE 5 % IV SOLN
3.0000 g | INTRAVENOUS | Status: AC
  Administered 2019-06-14: 08:00:00 3 g via INTRAVENOUS
  Filled 2019-06-13: qty 3000

## 2019-06-13 MED ORDER — PHENYLEPHRINE 40 MCG/ML (10ML) SYRINGE FOR IV PUSH (FOR BLOOD PRESSURE SUPPORT)
PREFILLED_SYRINGE | INTRAVENOUS | Status: AC
  Filled 2019-06-13: qty 20

## 2019-06-13 MED ORDER — BUPIVACAINE-EPINEPHRINE 0.25% -1:200000 IJ SOLN
INTRAMUSCULAR | Status: DC | PRN
  Administered 2019-06-13: 20 mL

## 2019-06-13 MED ORDER — FENTANYL CITRATE (PF) 100 MCG/2ML IJ SOLN
INTRAMUSCULAR | Status: DC | PRN
  Administered 2019-06-13: 100 ug via INTRAVENOUS
  Administered 2019-06-13: 50 ug via INTRAVENOUS
  Administered 2019-06-13 (×2): 100 ug via INTRAVENOUS
  Administered 2019-06-13 (×3): 50 ug via INTRAVENOUS

## 2019-06-13 SURGICAL SUPPLY — 115 items
APPLICATOR CHLORAPREP 10.5 ORG (MISCELLANEOUS) ×3 IMPLANT
APPLIER CLIP 11 MED OPEN (CLIP) ×6
BENZOIN TINCTURE PRP APPL 2/3 (GAUZE/BANDAGES/DRESSINGS) ×6 IMPLANT
BLADE CLIPPER SURG (BLADE) ×3 IMPLANT
BLADE SURG 10 STRL SS (BLADE) ×12 IMPLANT
BUR EGG ELITE 4.0 (BURR) ×2 IMPLANT
BUR EGG ELITE 4.0MM (BURR) ×1
CABLE BIPOLOR RESECTION CORD (MISCELLANEOUS) ×3 IMPLANT
CATH FOLEY 2WAY SLVR  5CC 16FR (CATHETERS) ×2
CATH FOLEY 2WAY SLVR 30CC 16FR (CATHETERS) ×3 IMPLANT
CATH FOLEY 2WAY SLVR 5CC 16FR (CATHETERS) ×1 IMPLANT
CLIP APPLIE 11 MED OPEN (CLIP) ×2 IMPLANT
CLIP LIGATING EXTRA MED SLVR (CLIP) ×3 IMPLANT
CLIP LIGATING EXTRA SM BLUE (MISCELLANEOUS) ×3 IMPLANT
CLOSURE STERI-STRIP 1/2X4 (GAUZE/BANDAGES/DRESSINGS) ×3
CLSR STERI-STRIP ANTIMIC 1/2X4 (GAUZE/BANDAGES/DRESSINGS) ×6 IMPLANT
COVER SURGICAL LIGHT HANDLE (MISCELLANEOUS) ×6 IMPLANT
COVER WAND RF STERILE (DRAPES) ×9 IMPLANT
DERMABOND ADVANCED (GAUZE/BANDAGES/DRESSINGS) ×2
DERMABOND ADVANCED .7 DNX12 (GAUZE/BANDAGES/DRESSINGS) ×1 IMPLANT
DISSECTOR BLUNT TIP ENDO 5MM (MISCELLANEOUS) ×3 IMPLANT
DRAPE C-ARM 42X72 X-RAY (DRAPES) ×15 IMPLANT
DRAPE C-ARMOR (DRAPES) ×9 IMPLANT
DRAPE INCISE IOBAN 66X45 STRL (DRAPES) ×3 IMPLANT
DRAPE POUCH INSTRU U-SHP 10X18 (DRAPES) ×3 IMPLANT
DRAPE SURG 17X23 STRL (DRAPES) ×3 IMPLANT
DRAPE U-SHAPE 47X51 STRL (DRAPES) ×9 IMPLANT
DRSG OPSITE POSTOP 4X10 (GAUZE/BANDAGES/DRESSINGS) ×3 IMPLANT
DRSG OPSITE POSTOP 4X6 (GAUZE/BANDAGES/DRESSINGS) ×6 IMPLANT
DRSG OPSITE POSTOP 4X8 (GAUZE/BANDAGES/DRESSINGS) ×3 IMPLANT
DURAPREP 26ML APPLICATOR (WOUND CARE) ×6 IMPLANT
ELECT BLADE 4.0 EZ CLEAN MEGAD (MISCELLANEOUS) ×9
ELECT BLADE 6.5 EXT (BLADE) ×3 IMPLANT
ELECT CAUTERY BLADE 6.4 (BLADE) ×3 IMPLANT
ELECT PENCIL ROCKER SW 15FT (MISCELLANEOUS) ×6 IMPLANT
ELECT REM PT RETURN 9FT ADLT (ELECTROSURGICAL) ×6
ELECTRODE BLDE 4.0 EZ CLN MEGD (MISCELLANEOUS) ×3 IMPLANT
ELECTRODE REM PT RTRN 9FT ADLT (ELECTROSURGICAL) ×2 IMPLANT
GAUZE 4X4 16PLY RFD (DISPOSABLE) IMPLANT
GLOVE BIO SURGEON STRL SZ 6.5 (GLOVE) ×4 IMPLANT
GLOVE BIO SURGEON STRL SZ7.5 (GLOVE) IMPLANT
GLOVE BIO SURGEON STRL SZ8.5 (GLOVE) ×3 IMPLANT
GLOVE BIO SURGEONS STRL SZ 6.5 (GLOVE) ×2
GLOVE BIOGEL PI IND STRL 6.5 (GLOVE) ×2 IMPLANT
GLOVE BIOGEL PI IND STRL 8.5 (GLOVE) ×4 IMPLANT
GLOVE BIOGEL PI INDICATOR 6.5 (GLOVE) ×4
GLOVE BIOGEL PI INDICATOR 8.5 (GLOVE) ×8
GLOVE SS BIOGEL STRL SZ 7.5 (GLOVE) ×1 IMPLANT
GLOVE SS BIOGEL STRL SZ 8.5 (GLOVE) ×3 IMPLANT
GLOVE SUPERSENSE BIOGEL SZ 7.5 (GLOVE) ×2
GLOVE SUPERSENSE BIOGEL SZ 8.5 (GLOVE) ×6
GOWN STRL REUS W/ TWL LRG LVL3 (GOWN DISPOSABLE) ×3 IMPLANT
GOWN STRL REUS W/ TWL XL LVL3 (GOWN DISPOSABLE) ×1 IMPLANT
GOWN STRL REUS W/TWL 2XL LVL3 (GOWN DISPOSABLE) ×15 IMPLANT
GOWN STRL REUS W/TWL LRG LVL3 (GOWN DISPOSABLE) ×6
GOWN STRL REUS W/TWL XL LVL3 (GOWN DISPOSABLE) ×2
HEMOSTAT SNOW SURGICEL 2X4 (HEMOSTASIS) IMPLANT
HEMOSTAT SURGICEL 2X14 (HEMOSTASIS) ×3 IMPLANT
INSERT FOGARTY 61MM (MISCELLANEOUS) IMPLANT
INSERT FOGARTY SM (MISCELLANEOUS) IMPLANT
KIT BASIN OR (CUSTOM PROCEDURE TRAY) ×6 IMPLANT
KIT BONE MRW ASP ANGEL CPRP (KITS) ×3 IMPLANT
KIT DILATOR XLIF 5 (KITS) ×1 IMPLANT
KIT SURGICAL ACCESS MAXCESS 4 (KITS) ×3 IMPLANT
KIT TURNOVER KIT B (KITS) ×6 IMPLANT
KIT XLIF (KITS) ×2
LIGHT SOURCE STRAIGHT TIP (INSTRUMENTS) ×6 IMPLANT
LOOP VESSEL MAXI BLUE (MISCELLANEOUS) IMPLANT
LOOP VESSEL MINI RED (MISCELLANEOUS) IMPLANT
MODULE EMG NEEDLE SSEP NVM5 (NEEDLE) ×3 IMPLANT
MODULE NVM5 NEXT GEN EMG (NEEDLE) ×3 IMPLANT
MODULUS XLW 12X22X55MM 10 (Spine Construct) ×6 IMPLANT
NEEDLE 22X1 1/2 (OR ONLY) (NEEDLE) ×3 IMPLANT
NEEDLE HYPO 18GX1.5 BLUNT FILL (NEEDLE) ×3 IMPLANT
NEEDLE SPNL 18GX3.5 QUINCKE PK (NEEDLE) ×6 IMPLANT
NS IRRIG 1000ML POUR BTL (IV SOLUTION) ×15 IMPLANT
PACK LAMINECTOMY ORTHO (CUSTOM PROCEDURE TRAY) ×6 IMPLANT
PACK UNIVERSAL I (CUSTOM PROCEDURE TRAY) ×6 IMPLANT
PAD ARMBOARD 7.5X6 YLW CONV (MISCELLANEOUS) ×18 IMPLANT
PUTTY DBM ALLOSYNC PURE 10CC (Putty) ×18 IMPLANT
SPACER OLIF 12X55 6D (Spacer) ×3 IMPLANT
SPONGE INTESTINAL PEANUT (DISPOSABLE) ×9 IMPLANT
SPONGE LAP 18X18 RF (DISPOSABLE) IMPLANT
SPONGE LAP 4X18 RFD (DISPOSABLE) ×3 IMPLANT
SPONGE SURGIFOAM ABS GEL 100 (HEMOSTASIS) IMPLANT
SPONGE TONSIL TAPE 1 RFD (DISPOSABLE) ×3 IMPLANT
STAPLER VISISTAT 35W (STAPLE) ×3 IMPLANT
SURGIFLO W/THROMBIN 8M KIT (HEMOSTASIS) ×12 IMPLANT
SUT BONE WAX W31G (SUTURE) ×3 IMPLANT
SUT MON AB 3-0 SH 27 (SUTURE) ×6
SUT MON AB 3-0 SH27 (SUTURE) ×3 IMPLANT
SUT PDS AB 1 CTX 36 (SUTURE) ×3 IMPLANT
SUT PROLENE 4 0 RB 1 (SUTURE)
SUT PROLENE 4-0 RB1 .5 CRCL 36 (SUTURE) IMPLANT
SUT PROLENE 5 0 C 1 24 (SUTURE) IMPLANT
SUT PROLENE 5 0 CC1 (SUTURE) IMPLANT
SUT PROLENE 6 0 C 1 30 (SUTURE) ×3 IMPLANT
SUT PROLENE 6 0 CC (SUTURE) IMPLANT
SUT SILK 0 TIES 10X30 (SUTURE) ×3 IMPLANT
SUT SILK 2 0 TIES 10X30 (SUTURE) ×6 IMPLANT
SUT SILK 2 0SH CR/8 30 (SUTURE) IMPLANT
SUT SILK 3 0 TIES 10X30 (SUTURE) ×6 IMPLANT
SUT SILK 3 0SH CR/8 30 (SUTURE) IMPLANT
SUT VIC AB 1 CT1 18XCR BRD 8 (SUTURE) ×2 IMPLANT
SUT VIC AB 1 CT1 27 (SUTURE) ×4
SUT VIC AB 1 CT1 27XBRD ANBCTR (SUTURE) ×2 IMPLANT
SUT VIC AB 1 CT1 8-18 (SUTURE) ×4
SUT VIC AB 2-0 CT1 18 (SUTURE) ×12 IMPLANT
SYR BULB IRRIGATION 50ML (SYRINGE) ×6 IMPLANT
SYR CONTROL 10ML LL (SYRINGE) ×6 IMPLANT
TAPE CLOTH 4X10 WHT NS (GAUZE/BANDAGES/DRESSINGS) ×3 IMPLANT
TOWEL GREEN STERILE (TOWEL DISPOSABLE) ×9 IMPLANT
TOWEL GREEN STERILE FF (TOWEL DISPOSABLE) ×6 IMPLANT
TRAP SPECIMEN MUCOUS 40CC (MISCELLANEOUS) ×3 IMPLANT
WATER STERILE IRR 1000ML POUR (IV SOLUTION) ×3 IMPLANT

## 2019-06-13 NOTE — Op Note (Signed)
Operative report  Preoperative diagnosis: Degenerative lumbar disc disease, lumbar spinal stenosis with neurogenic claudication.  Postoperative diagnosis: Same  Operative procedure: Oblique lumbar interbody fusion L4-5.  Lateral interbody fusion L2-3 and L3-4.  Complications: None  Approach surgeon for oblique lumbar interbody fusion: Dr. Tawanna Cooler Early  First Assistant: Marchelle Folks Ward, PA  EBL: 400 cc  Cell Saver return: 150 cc  Implants:  L2-4: NuVasive 3D printed titanium intervertebral cage: 12 x 22 x 55.  10 degree lordotic.   L4-5: Medtronic pivot cage: 12 x 55.  Allograft: Allosync  Neuro monitoring: No adverse SSEP or free running EMG activity throughout the entire case  Indications: Cameron Curry is a pleasant 69 year old gentleman with severe debilitating back buttock and radicular leg pain.  Despite appropriate conservative management his quality of life is continued to he is having intermittent weakness in the lower extremity causing him to fall as well as progressive debilitating back pain.  After discussing treatment options we elected to move forward with a stage anterior posterior spinal fusion.  Today was the anterior interbody fixation at L2-5.  All appropriate risks benefits and indications were explained to the patient and consent was obtained.  Operative report: Patient is brought the operating room placed upon the operating room table.  After successful induction of general anesthesia and endotracheal the patient teds SCDs and a Foley were inserted.  Neuro monitoring pads were placed and the patient was turned into the lateral decubitus position left side up on the surgical table.  All bony prominences were well-padded axillary roll was placed and the left arm was placed in a well arm holder.  Once the patient was properly positioned and confirmed with x-ray he was secured directly to the table with tape.  The lateral, anterior and posterior flank was then prepped and draped in a  standard fashion.  Timeout was taken to confirm patient procedure and all other important data.  At this point time Dr. Arbie Cookey performed a standard oblique anterior approach to the lumbar spine.  Please refer to his dictation for details of the approach.  Once he had placed the retractors and we identified that we are at the L4-5 level he scrubbed out and I moved forward with the surgery.  An annulotomy was performed with a 10 blade scalpel and then I used pituitary rongeurs to remove the bulk of the disc material.  I then used an osteotome and advanced it across the disc space down to the contralateral annulus.  I released this by driving osteotome across the annulus.  Once I released the contralateral annulus I continue to use box osteotomes to remove the degenerated disc material I then used a rasp, pituitary rongeurs, and curettes to remove the remaining portion of bone and cartilaginous endplate.  At this point with the discectomy complete I then began trialing intervertebral spacers.  I found that the size 12 lordotic cage provide the best overall fit and restoration of posterior foraminal volume.  The implant was obtained and packed with the allosync bone graft.  Should be noted that I did aspirate iliac crest bone graft in order to mix with the allosync to improve the overall efficacy of the bone graft.    The cage after packed with the allograft was then inserted and malleted across the disc space to the contralateral side.  I confirmed satisfactory position of the implant using both AP and lateral fluoroscopy.  The implant itself was well-seated and secure.  I remove the inserting device and confirmed  satisfactory position of the peek intervertebral cage.  At this point the wound was copiously irrigated with normal saline and I used bipolar cautery and FloSeal to obtain hemostasis.  The retractors were sequentially removed to confirm satisfactory hemostasis.  After final copious irrigation I closed  the fascia of the internal oblique/rectus with a running #1 PDS suture.  Once this was closed I irrigated and then closed the remainder of the skin in a layered fashion with interrupted #1 Vicryl suture, 2-0 Vicryl suture, and a 3-0 Monocryl.  With the oblique anterior incision closed we proceeded with the lateral interbody fusion.  Identified the midportion of the L3 vertebral body and marked out the anterior and posterior margins.  I infiltrated the incision site with quarter percent Marcaine with epinephrine.  Incision was made I sharply dissected down to the fascia of the external oblique.  A second small incision was made posteriorly and I bluntly dissected down to the posterior aspect of the retroperitoneal I bluntly dissected through this and into the retroperitoneum.  I then bluntly dissected and mobilized the adipose tissue I then dissected through the undersurface of the external oblique until I could visualize my finger in the lateral transverse incision using this 2 incision technique I was able to mobilize the retroperitoneal fascia in order to facilitate placing the initial dilating tube.  The dilating tube was placed down to the surface of the psoas over the L2-3 disc space.  I confirmed satisfactory position with fluoroscopy and then stimulated the surface of the psoas to ensure was not traumatizing the lumbar plexus.  I then advanced through the psoas down to the lateral aspect of the disc once I confirm satisfactory initial position of the trocar I placed the guidepin to secure it in place.  I then circumferentially stimulated the psoas and then sequentially dilated until I was at the final dilator.  I then placed the working retractor over the final dilator and secured the arm.  I then removed the dilators and I exposed the lateral aspect of the disc space. Using fluoroscopy and direct visualization I vision the posterior blade in the appropriate place over the disc space.  I then stimulated  with the neuro probe posterior inferior and superior to the blades to ensure that I was not traumatizing the lumbar plexus.  Once this was confirmed I confirmed satisfactory position of the retractor in both the AP and lateral planes.  Once confirmed I advanced the trocar into the disc space to stabilize the posterior blade.  This allowed me to retract anteriorly and completely expose the lateral surface of the disc space.  I again checked behind the blades the neurostimulator to confirm the plexus was not being traumatized.  Annulotomy was performed with a 10 blade scalpel and then I used pituitary rongeurs, curettes, box osteotome, and rasps to remove all the disc material.  I released the contralateral annulus as well.  I made sure I removed all the cartilaginous endplate and had bleeding subchondral bone.  I then sequentially trialed intervertebral spacers and elected to use the size 12 lordotic 3D implanted cage.  This cage was obtained packed with the allograft and then malleted to the appropriate depth.  X-rays demonstrated satisfactory position of the intervertebral cage in both planes.  I irrigated the wound copiously normal saline and then removed the retractor and placed FloSeal to aid in hemostasis.  At this point I repositioned my dilating tubes over the L3-4 disc space and using the same technique  sequentially dilated and stimulated to confirm no trauma to the plexus and then placed the working trocar.  I positioned it appropriately and confirmed with AP and lateral fluoroscopy.  The posterior blade was secured into the disc space and I stimulated circumferentially to ensure the plexus was not being traumatized.  I then opened the retractor to fully expose the lateral aspect of the disc space.  10 blade scalpel was used to perform an annulotomy and then using the same technique that I had utilized at L2-3 I performed a discectomy at L3-4.  I again released the contralateral annulus with  osteotomes and make sure I bleeding subchondral bone.  Once I had removed all the disc material and I had a proper discectomy I then trialed and elected use the same size implant at this level.  The implant was obtained packed with the allograft and malleted to the appropriate depth.  At this point I irrigated the wound copiously normal saline and gently removed all the retractors.  Final intraoperative x-rays demonstrate satisfactory position of all 3 intervertebral devices.  There was excellent indirect foraminal decompression and the overall alignment in both the coronal and sagittal plane was improved.  At this point the lateral wound was irrigated copiously normal saline and I made sure that hemostasis using bipolar cautery and FloSeal.  I closed the fascia of the external oblique with interrupted #1 Vicryl sutures and I placed deep #1 Vicryl sutures in the second posterior wound.  I then closed the wounds with interrupted 2-0 Vicryl suture and then 3-0 Monocryl.  Steri-Strips dry dressings were applied to all 3 wounds.  Patient was ultimately extubated transfer the PACU without incident.  The end of the case all needle and sponge counts were correct.  There were no adverse intraoperative events.  Patient will be admitted to [facility] and will return to the operating room tomorrow for posterior supplemental pedicle screw fixation.

## 2019-06-13 NOTE — Transfer of Care (Signed)
Immediate Anesthesia Transfer of Care Note  Patient: Jaquese Irving  Procedure(s) Performed: Oblique lateral lumbar interbody fusion L4-5, extreme lateral lumbar interbody fusion L2-4 (N/A ) Oblique lumbar interbody fusion L4-5, anterior lateral lumbar fusion L2-4 (N/A ) ABDOMINAL EXPOSURE (N/A )  Patient Location: PACU  Anesthesia Type:General  Level of Consciousness: awake, drowsy and patient cooperative  Airway & Oxygen Therapy: Patient Spontanous Breathing and Patient connected to face mask oxygen  Post-op Assessment: Report given to RN and Post -op Vital signs reviewed and stable  Post vital signs: Reviewed and stable  Last Vitals:  Vitals Value Taken Time  BP 140/91 06/13/19 1501  Temp    Pulse 93 06/13/19 1503  Resp 22 06/13/19 1503  SpO2 98 % 06/13/19 1503  Vitals shown include unvalidated device data.  Last Pain:  Vitals:   06/13/19 0644  TempSrc: Oral  PainSc: 7          Complications: No apparent anesthesia complications

## 2019-06-13 NOTE — Anesthesia Procedure Notes (Signed)
Anesthesia Regional Block: TAP block   Pre-Anesthetic Checklist: ,, timeout performed, Correct Patient, Correct Site, Correct Laterality, Correct Procedure, Correct Position, site marked, Risks and benefits discussed,  Surgical consent,  Pre-op evaluation,  At surgeon's request and post-op pain management  Laterality: Left  Prep: chloraprep       Needles:   Needle Type: Echogenic Needle          Additional Needles:   Procedures: Doppler guided,,,, ultrasound used (permanent image in chart),,,,  Narrative:  Start time: 06/13/2019 8:10 AM End time: 06/13/2019 8:25 AM Injection made incrementally with aspirations every 5 mL.  Performed by: Personally  Anesthesiologist: Dorris Singh, MD

## 2019-06-13 NOTE — H&P (Signed)
Addendum H&P  Cameron Curry is a very pleasant 69 year old gentleman with severe debilitating low back and radicular leg pain.  Patient has had multiple falls due to left leg weakness and radicular leg pain.  Ridging studies confirm moderate to severe lateral recess stenosis and foraminal stenosis at L3-4 along with degenerative lumbar disc disease.  Also has significant foraminal degeneration left-sided L4-5 along with facet cysts producing significant spinal stenosis.  Patient also has degenerative lumbar disease with chronic wedge deformity at L2-3.  Producing severe foraminal stenosis.  Despite appropriate conservative management consisting of physical therapy and injection therapy the patient's quality of life is continued to deteriorate.  As a result we have elected to move forward with surgery.  Plan is a 2 staged fusion procedure.  First stage will be an anterior interbody fusion at L2-3, L3-4, and L4-5.  Stage II will be a posterior supplemental pedicle screw fixation L2-5.  I have reviewed the procedure along with the risks and benefits of surgery with the patient and he is expressed a desire to move forward with surgery.  The patient has been seen and cleared from his primary care physician as well as his cardiologist.

## 2019-06-13 NOTE — Anesthesia Procedure Notes (Signed)
Arterial Line Insertion Start/End2/07/2019 8:05 AM, 06/13/2019 8:20 AM Performed by: Marena Chancy, CRNA  Patient location: Pre-op. Preanesthetic checklist: patient identified, IV checked, site marked, risks and benefits discussed, surgical consent, monitors and equipment checked, pre-op evaluation and timeout performed Lidocaine 1% used for infiltration and patient sedated Right, radial was placed Catheter size: 20 G Hand hygiene performed  and maximum sterile barriers used  Allen's test indicative of satisfactory collateral circulation Attempts: 2 Procedure performed without using ultrasound guided technique. Following insertion, dressing applied and Biopatch.

## 2019-06-13 NOTE — Anesthesia Preprocedure Evaluation (Addendum)
Anesthesia Evaluation  Patient identified by MRN, date of birth, ID band Patient awake    Reviewed: Allergy & Precautions, NPO status , Patient's Chart, lab work & pertinent test results, reviewed documented beta blocker date and time   History of Anesthesia Complications Negative for: history of anesthetic complications  Airway Mallampati: II  TM Distance: >3 FB Neck ROM: Full    Dental no notable dental hx.    Pulmonary neg pulmonary ROS,    Pulmonary exam normal        Cardiovascular hypertension, Pt. on medications and Pt. on home beta blockers Normal cardiovascular exam+ dysrhythmias (s/p ablation) Atrial Fibrillation      Neuro/Psych negative neurological ROS  negative psych ROS   GI/Hepatic negative GI ROS, Neg liver ROS,   Endo/Other  Hypothyroidism   Renal/GU negative Renal ROS  negative genitourinary   Musculoskeletal  (+) Arthritis ,   Abdominal   Peds  Hematology negative hematology ROS (+)   Anesthesia Other Findings Day of surgery medications reviewed with patient.  Reproductive/Obstetrics negative OB ROS                            Anesthesia Physical Anesthesia Plan  ASA: II  Anesthesia Plan: General   Post-op Pain Management:    Induction: Intravenous  PONV Risk Score and Plan: 3 and Treatment may vary due to age or medical condition, Ondansetron, Dexamethasone and Propofol infusion  Airway Management Planned: Oral ETT  Additional Equipment: Arterial line  Intra-op Plan:   Post-operative Plan: Extubation in OR  Informed Consent: I have reviewed the patients History and Physical, chart, labs and discussed the procedure including the risks, benefits and alternatives for the proposed anesthesia with the patient or authorized representative who has indicated his/her understanding and acceptance.     Dental advisory given  Plan Discussed with:  CRNA  Anesthesia Plan Comments:       Anesthesia Quick Evaluation

## 2019-06-13 NOTE — Op Note (Signed)
    OPERATIVE REPORT  DATE OF SURGERY: 06/13/2019  PATIENT: Cameron Curry, 69 y.o. male MRN: [id number]  DOB: Apr 07, 1951  PRE-OPERATIVE DIAGNOSIS: Degenerative disc disease  POST-OPERATIVE DIAGNOSIS:  Same  PROCEDURE: Closure of L4-5 with oblique exposure  SURGEON:  Gretta Began, M.D.  Co-surgeon for the exposure Dr. Venita Lick  PHYSICIAN ASSISTANT: Dr. Clotilde Dieter  ANESTHESIA: General  EBL: per anesthesia record  Total I/O In: 3900 [I.V.:3250; Blood:150; IV Piggyback:500] Out: 800 [Urine:400; Blood:400]  BLOOD ADMINISTERED: none  DRAINS: none  SPECIMEN: none  COUNTS CORRECT:  YES  PATIENT DISPOSITION:  PACU - hemodynamically stable  PROCEDURE DETAILS: Patient medication with down left side up.  C-arm imaging was used to identify the level of the 4 5 disc space in line wit the skin.  A oblique incision was made just several fingerbreadths anterior superior iliac spine.  The anterior fascia was opened and the oblique muscles were used for a mobilization bluntly through this.  The retroperitoneal space was entered and blunt dissection was continued above the level of the psoas muscle.  The retractor system was brought onto the retractors were initially positioned to give lateral exposure to the right and left.  He has been mobilized significantly more of a superior and inferior exposure for 5 minutes.  Needle was placed in the disc and C-arm was brought back 25 to confirm this was the 4 5 level.  The psoas muscle was mobilized posteriorly for exposure.  The trial was easily positioned to the level of the disc.  The remainder the exposure he was dictated as a separate note by Dr. Jethro Bastos, M.D., [facility] 06/13/2019 2:55 PM

## 2019-06-13 NOTE — Anesthesia Procedure Notes (Signed)
Procedure Name: Intubation Date/Time: 06/13/2019 8:46 AM Performed by: Fransisca Kaufmann, CRNA Pre-anesthesia Checklist: Patient identified, Emergency Drugs available, Suction available and Patient being monitored Patient Re-evaluated:Patient Re-evaluated prior to induction Oxygen Delivery Method: Circle System Utilized Preoxygenation: Pre-oxygenation with 100% oxygen Induction Type: IV induction Ventilation: Mask ventilation without difficulty Laryngoscope Size: Glidescope and 4 Grade View: Grade I Tube type: Oral Tube size: 7.5 mm Number of attempts: 1 Airway Equipment and Method: Stylet and Oral airway Placement Confirmation: ETT inserted through vocal cords under direct vision,  positive ETCO2 and breath sounds checked- equal and bilateral Secured at: 23 cm Tube secured with: Tape Dental Injury: Teeth and Oropharynx as per pre-operative assessment

## 2019-06-13 NOTE — Anesthesia Postprocedure Evaluation (Signed)
Anesthesia Post Note  Patient: Gumaro Brightbill  Procedure(s) Performed: Oblique lateral lumbar interbody fusion L4-5, extreme lateral lumbar interbody fusion L2-4 (N/A ) Oblique lumbar interbody fusion L4-5, anterior lateral lumbar fusion L2-4 (N/A ) ABDOMINAL EXPOSURE (N/A )     Patient location during evaluation: PACU Anesthesia Type: General Level of consciousness: awake and alert Pain management: pain level controlled Vital Signs Assessment: post-procedure vital signs reviewed and stable Respiratory status: spontaneous breathing, nonlabored ventilation, respiratory function stable and patient connected to nasal cannula oxygen Cardiovascular status: blood pressure returned to baseline and stable Postop Assessment: no apparent nausea or vomiting Anesthetic complications: no    Last Vitals:  Vitals:   06/13/19 1545 06/13/19 1601  BP: (!) 152/89 (!) 151/94  Pulse: 85 82  Resp: (!) 24 20  Temp:    SpO2: 91% 95%    Last Pain:  Vitals:   06/13/19 1601  TempSrc:   PainSc: Asleep    LLE Motor Response: Purposeful movement (06/13/19 1601) LLE Sensation: Full sensation (06/13/19 1601) RLE Motor Response: Purposeful movement (06/13/19 1601) RLE Sensation: Full sensation (06/13/19 1601)      Hurschel Paynter,W. EDMOND

## 2019-06-13 NOTE — Brief Op Note (Signed)
06/13/2019  2:30 PM  PATIENT:  Cameron Curry  69 y.o. male  PRE-OPERATIVE DIAGNOSIS:  Degnerative scoliosis with spinal stenosis  POST-OPERATIVE DIAGNOSIS:  Degnerative scoliosis with spinal stenosis  PROCEDURE:  Procedure(s) with comments: Oblique lateral lumbar interbody fusion L4-5, extreme lateral lumbar interbody fusion L2-4 (N/A) - 5 hrs Dr. Arbie Cookey do approach Left sided tap block with exparel Oblique lumbar interbody fusion L4-5, anterior lateral lumbar fusion L2-4 (N/A) ABDOMINAL EXPOSURE (N/A)  SURGEON:  Surgeon(s) and Role: Panel 1:    Venita Lick, MD - Primary Panel 2:    * Early, Kristen Loader, MD - Primary    * Cephus Shelling, MD - Assisting  PHYSICIAN ASSISTANT:   ASSISTANTS: Amanda Ward, PA   ANESTHESIA:   general  EBL:  400 mL   BLOOD ADMINISTERED:none  DRAINS: none   LOCAL MEDICATIONS USED:  MARCAINE     SPECIMEN:  No Specimen  DISPOSITION OF SPECIMEN:  N/A  COUNTS:  YES  TOURNIQUET:  * No tourniquets in log *  DICTATION: .Dragon Dictation  PLAN OF CARE: Admit to inpatient   PATIENT DISPOSITION:  PACU - hemodynamically stable.

## 2019-06-14 ENCOUNTER — Inpatient Hospital Stay (HOSPITAL_COMMUNITY): Payer: Medicare Other

## 2019-06-14 ENCOUNTER — Encounter (HOSPITAL_COMMUNITY): Payer: Self-pay | Admitting: Orthopedic Surgery

## 2019-06-14 ENCOUNTER — Inpatient Hospital Stay (HOSPITAL_COMMUNITY): Admission: RE | Admit: 2019-06-14 | Payer: Medicare Other | Source: Home / Self Care | Admitting: Orthopedic Surgery

## 2019-06-14 ENCOUNTER — Inpatient Hospital Stay (HOSPITAL_COMMUNITY)
Admission: RE | Disposition: A | Discharge status: Home / Self Care | Payer: Self-pay | Source: Home / Self Care | Attending: Orthopedic Surgery

## 2019-06-14 SURGERY — POSTERIOR LUMBAR FUSION 3 LEVEL
Anesthesia: General

## 2019-06-14 MED ORDER — EPINEPHRINE PF 1 MG/ML IJ SOLN
INTRAMUSCULAR | Status: DC | PRN
  Administered 2019-06-14: .15 mL

## 2019-06-14 MED ORDER — EPHEDRINE SULFATE 50 MG/ML IJ SOLN
INTRAMUSCULAR | Status: DC | PRN
  Administered 2019-06-14: 10 mg via INTRAVENOUS
  Administered 2019-06-14: 5 mg via INTRAVENOUS
  Administered 2019-06-14: 10 mg via INTRAVENOUS

## 2019-06-14 MED ORDER — PHENYLEPHRINE HCL-NACL 10-0.9 MG/250ML-% IV SOLN
INTRAVENOUS | Status: DC | PRN
  Administered 2019-06-14: 50 ug/min via INTRAVENOUS

## 2019-06-14 MED ORDER — OXYCODONE HCL 5 MG PO TABS: 10.0000 mg | ORAL_TABLET | ORAL | Status: DC | PRN

## 2019-06-14 MED ORDER — THROMBIN 20000 UNITS EX SOLR
CUTANEOUS | Status: DC | PRN
  Administered 2019-06-14: 20 mL via TOPICAL

## 2019-06-14 MED ORDER — SODIUM CHLORIDE 0.9% FLUSH
3.0000 mL | Freq: Two times a day (BID) | INTRAVENOUS | Status: DC
  Administered 2019-06-14 – 2019-06-15 (×3): 3 mL via INTRAVENOUS

## 2019-06-14 MED ORDER — ONDANSETRON HCL 4 MG/2ML IJ SOLN
INTRAMUSCULAR | Status: DC | PRN
  Administered 2019-06-14: 4 mg via INTRAVENOUS

## 2019-06-14 MED ORDER — OXYCODONE HCL 5 MG PO TABS: 5.0000 mg | ORAL_TABLET | ORAL | Status: DC | PRN

## 2019-06-14 MED ORDER — THROMBIN (RECOMBINANT) 20000 UNITS EX SOLR
CUTANEOUS | Status: AC
  Filled 2019-06-14: qty 20000

## 2019-06-14 MED ORDER — POLYETHYLENE GLYCOL 3350 17 G PO PACK: 17.0000 g | PACK | Freq: Every day | ORAL | Status: DC | PRN

## 2019-06-14 MED ORDER — SUCCINYLCHOLINE CHLORIDE 200 MG/10ML IV SOSY
PREFILLED_SYRINGE | INTRAVENOUS | Status: DC | PRN
  Administered 2019-06-14: 180 mg via INTRAVENOUS

## 2019-06-14 MED ORDER — MIDAZOLAM HCL 5 MG/5ML IJ SOLN
INTRAMUSCULAR | Status: DC | PRN
  Administered 2019-06-14: 2 mg via INTRAVENOUS

## 2019-06-14 MED ORDER — HEMOSTATIC AGENTS (NO CHARGE) OPTIME
TOPICAL | Status: DC | PRN
  Administered 2019-06-14: 2

## 2019-06-14 MED ORDER — EPINEPHRINE PF 1 MG/ML IJ SOLN
INTRAMUSCULAR | Status: AC
  Filled 2019-06-14: qty 1

## 2019-06-14 MED ORDER — CEFAZOLIN SODIUM-DEXTROSE 1-4 GM/50ML-% IV SOLN
1.0000 g | Freq: Three times a day (TID) | INTRAVENOUS | Status: AC
  Administered 2019-06-14 (×2): 1 g via INTRAVENOUS
  Filled 2019-06-14 (×2): qty 50

## 2019-06-14 MED ORDER — 0.9 % SODIUM CHLORIDE (POUR BTL) OPTIME
TOPICAL | Status: DC | PRN
  Administered 2019-06-14: 1000 mL

## 2019-06-14 MED ORDER — PROMETHAZINE HCL 25 MG/ML IJ SOLN: 6.2500 mg | INTRAMUSCULAR | Status: DC | PRN

## 2019-06-14 MED ORDER — PROPOFOL 500 MG/50ML IV EMUL
INTRAVENOUS | Status: DC | PRN
  Administered 2019-06-14: 30 ug/kg/min via INTRAVENOUS

## 2019-06-14 MED ORDER — SODIUM CHLORIDE 0.9 % IV SOLN: 250.0000 mL | INTRAVENOUS | Status: DC

## 2019-06-14 MED ORDER — OXYCODONE HCL 5 MG PO TABS: 5.0000 mg | ORAL_TABLET | Freq: Once | ORAL | Status: DC | PRN

## 2019-06-14 MED ORDER — ONDANSETRON HCL 4 MG/2ML IJ SOLN: 4.0000 mg | Freq: Four times a day (QID) | INTRAMUSCULAR | Status: DC | PRN

## 2019-06-14 MED ORDER — BUPIVACAINE HCL (PF) 0.25 % IJ SOLN
INTRAMUSCULAR | Status: DC | PRN
  Administered 2019-06-14: 30 mL

## 2019-06-14 MED ORDER — BUPIVACAINE HCL (PF) 0.25 % IJ SOLN
INTRAMUSCULAR | Status: AC
  Filled 2019-06-14: qty 30

## 2019-06-14 MED ORDER — DEXAMETHASONE SODIUM PHOSPHATE 10 MG/ML IJ SOLN
INTRAMUSCULAR | Status: DC | PRN
  Administered 2019-06-14: 10 mg via INTRAVENOUS

## 2019-06-14 MED ORDER — METHOCARBAMOL 500 MG PO TABS: 500.0000 mg | ORAL_TABLET | Freq: Three times a day (TID) | ORAL | 0 refills | Status: DC | PRN

## 2019-06-14 MED ORDER — SODIUM CHLORIDE 0.9% FLUSH: 3.0000 mL | INTRAVENOUS | Status: DC | PRN

## 2019-06-14 MED ORDER — LIDOCAINE 2% (20 MG/ML) 5 ML SYRINGE
INTRAMUSCULAR | Status: DC | PRN
  Administered 2019-06-14: 100 mg via INTRAVENOUS

## 2019-06-14 MED ORDER — PHENYLEPHRINE 40 MCG/ML (10ML) SYRINGE FOR IV PUSH (FOR BLOOD PRESSURE SUPPORT)
PREFILLED_SYRINGE | INTRAVENOUS | Status: DC | PRN
  Administered 2019-06-14: 80 ug via INTRAVENOUS
  Administered 2019-06-14: 120 ug via INTRAVENOUS
  Administered 2019-06-14 (×4): 80 ug via INTRAVENOUS

## 2019-06-14 MED ORDER — OXYCODONE-ACETAMINOPHEN 10-325 MG PO TABS: 1.0000 | ORAL_TABLET | Freq: Four times a day (QID) | ORAL | 0 refills | Status: DC | PRN

## 2019-06-14 MED ORDER — METHOCARBAMOL 1000 MG/10ML IJ SOLN
500.0000 mg | Freq: Four times a day (QID) | INTRAVENOUS | Status: DC | PRN
  Filled 2019-06-14: qty 5

## 2019-06-14 MED ORDER — HYDROMORPHONE HCL 1 MG/ML IJ SOLN
INTRAMUSCULAR | Status: AC
  Filled 2019-06-14: qty 1

## 2019-06-14 MED ORDER — PHENOL 1.4 % MT LIQD: 1.0000 | OROMUCOSAL | Status: DC | PRN

## 2019-06-14 MED ORDER — ACETAMINOPHEN 650 MG RE SUPP: 650.0000 mg | RECTAL | Status: DC | PRN

## 2019-06-14 MED ORDER — LACTATED RINGERS IV SOLN: INTRAVENOUS | Status: DC

## 2019-06-14 MED ORDER — OXYCODONE HCL 5 MG/5ML PO SOLN: 5.0000 mg | Freq: Once | ORAL | Status: DC | PRN

## 2019-06-14 MED ORDER — HYDROMORPHONE HCL 1 MG/ML IJ SOLN
0.2500 mg | INTRAMUSCULAR | Status: DC | PRN
  Administered 2019-06-14 (×2): 0.5 mg via INTRAVENOUS

## 2019-06-14 MED ORDER — OXYCODONE HCL 5 MG PO TABS
5.0000 mg | ORAL_TABLET | ORAL | Status: DC | PRN
  Administered 2019-06-14 – 2019-06-15 (×3): 10 mg via ORAL
  Administered 2019-06-15: 10:00:00 5 mg via ORAL
  Administered 2019-06-15: 01:00:00 10 mg via ORAL
  Filled 2019-06-14 (×5): qty 2

## 2019-06-14 MED ORDER — ONDANSETRON HCL 4 MG PO TABS: 4.0000 mg | ORAL_TABLET | Freq: Four times a day (QID) | ORAL | Status: DC | PRN

## 2019-06-14 MED ORDER — DOCUSATE SODIUM 100 MG PO CAPS
100.0000 mg | ORAL_CAPSULE | Freq: Two times a day (BID) | ORAL | Status: DC
  Administered 2019-06-14: 13:00:00 100 mg via ORAL

## 2019-06-14 MED ORDER — FENTANYL CITRATE (PF) 250 MCG/5ML IJ SOLN
INTRAMUSCULAR | Status: DC | PRN
  Administered 2019-06-14: 100 ug via INTRAVENOUS
  Administered 2019-06-14: 25 ug via INTRAVENOUS

## 2019-06-14 MED ORDER — MENTHOL 3 MG MT LOZG: 1.0000 | LOZENGE | OROMUCOSAL | Status: DC | PRN

## 2019-06-14 MED ORDER — GABAPENTIN 300 MG PO CAPS
300.0000 mg | ORAL_CAPSULE | Freq: Three times a day (TID) | ORAL | Status: DC
  Administered 2019-06-14 – 2019-06-17 (×9): 300 mg via ORAL
  Filled 2019-06-14 (×9): qty 1

## 2019-06-14 MED ORDER — MORPHINE SULFATE (PF) 2 MG/ML IV SOLN: 2.0000 mg | INTRAVENOUS | Status: DC | PRN

## 2019-06-14 MED ORDER — MAGNESIUM CITRATE PO SOLN: 1.0000 | Freq: Once | ORAL | Status: DC | PRN

## 2019-06-14 MED ORDER — ACETAMINOPHEN 10 MG/ML IV SOLN
1000.0000 mg | Freq: Once | INTRAVENOUS | Status: DC | PRN
  Administered 2019-06-14: 11:00:00 1000 mg via INTRAVENOUS

## 2019-06-14 MED ORDER — METHOCARBAMOL 500 MG PO TABS
500.0000 mg | ORAL_TABLET | Freq: Four times a day (QID) | ORAL | Status: DC | PRN
  Administered 2019-06-14 – 2019-06-17 (×9): 500 mg via ORAL
  Filled 2019-06-14 (×9): qty 1

## 2019-06-14 MED ORDER — ACETAMINOPHEN 10 MG/ML IV SOLN
INTRAVENOUS | Status: AC
  Filled 2019-06-14: qty 100

## 2019-06-14 MED ORDER — PROPOFOL 10 MG/ML IV BOLUS
INTRAVENOUS | Status: DC | PRN
  Administered 2019-06-14: 200 mg via INTRAVENOUS

## 2019-06-14 MED ORDER — ACETAMINOPHEN 325 MG PO TABS
650.0000 mg | ORAL_TABLET | ORAL | Status: DC | PRN
  Administered 2019-06-14 – 2019-06-15 (×3): 650 mg via ORAL
  Filled 2019-06-14 (×3): qty 2

## 2019-06-14 MED ORDER — ONDANSETRON HCL 4 MG PO TABS: 4.0000 mg | ORAL_TABLET | Freq: Three times a day (TID) | ORAL | 0 refills | Status: DC | PRN

## 2019-06-14 MED FILL — Thrombin (Recombinant) For Soln 20000 Unit: CUTANEOUS | Qty: 1 | Status: AC

## 2019-06-14 SURGICAL SUPPLY — 72 items
BLADE CLIPPER SURG (BLADE) ×3 IMPLANT
BUR EGG ELITE 4.0 (BURR) IMPLANT
BUR EGG ELITE 4.0MM (BURR)
CABLE BIPOLOR RESECTION CORD (MISCELLANEOUS) ×3 IMPLANT
CLIP NEUROVISION LG (CLIP) ×3 IMPLANT
CLOSURE STERI-STRIP 1/2X4 (GAUZE/BANDAGES/DRESSINGS) ×2
CLSR STERI-STRIP ANTIMIC 1/2X4 (GAUZE/BANDAGES/DRESSINGS) ×4 IMPLANT
COVER MAYO STAND STRL (DRAPES) ×3 IMPLANT
COVER SURGICAL LIGHT HANDLE (MISCELLANEOUS) ×3 IMPLANT
COVER WAND RF STERILE (DRAPES) IMPLANT
DRAPE C-ARM 42X72 X-RAY (DRAPES) ×3 IMPLANT
DRAPE C-ARMOR (DRAPES) ×3 IMPLANT
DRAPE POUCH INSTRU U-SHP 10X18 (DRAPES) ×3 IMPLANT
DRAPE SURG 17X23 STRL (DRAPES) ×3 IMPLANT
DRAPE U-SHAPE 47X51 STRL (DRAPES) ×3 IMPLANT
DRSG OPSITE POSTOP 4X6 (GAUZE/BANDAGES/DRESSINGS) ×3 IMPLANT
DRSG OPSITE POSTOP 4X8 (GAUZE/BANDAGES/DRESSINGS) ×6 IMPLANT
DURAPREP 26ML APPLICATOR (WOUND CARE) ×3 IMPLANT
ELECT BLADE 4.0 EZ CLEAN MEGAD (MISCELLANEOUS) ×3
ELECT BLADE 6.5 EXT (BLADE) IMPLANT
ELECT CAUTERY BLADE 6.4 (BLADE) ×3 IMPLANT
ELECT PENCIL ROCKER SW 15FT (MISCELLANEOUS) ×3 IMPLANT
ELECT REM PT RETURN 9FT ADLT (ELECTROSURGICAL) ×3
ELECTRODE BLDE 4.0 EZ CLN MEGD (MISCELLANEOUS) ×1 IMPLANT
ELECTRODE REM PT RTRN 9FT ADLT (ELECTROSURGICAL) ×1 IMPLANT
GLOVE BIOGEL PI IND STRL 8.5 (GLOVE) ×1 IMPLANT
GLOVE BIOGEL PI INDICATOR 8.5 (GLOVE) ×2
GLOVE SS BIOGEL STRL SZ 8.5 (GLOVE) ×1 IMPLANT
GLOVE SUPERSENSE BIOGEL SZ 8.5 (GLOVE) ×2
GOWN STRL REUS W/ TWL LRG LVL3 (GOWN DISPOSABLE) ×1 IMPLANT
GOWN STRL REUS W/TWL 2XL LVL3 (GOWN DISPOSABLE) ×6 IMPLANT
GOWN STRL REUS W/TWL LRG LVL3 (GOWN DISPOSABLE) ×2
GUIDEWIRE NITINOL BEVEL TIP (WIRE) ×24 IMPLANT
KIT BASIN OR (CUSTOM PROCEDURE TRAY) ×3 IMPLANT
KIT POSITION SURG JACKSON T1 (MISCELLANEOUS) ×3 IMPLANT
KIT TURNOVER KIT B (KITS) ×3 IMPLANT
MODULE EMG NEEDLE SSEP NVM5 (NEEDLE) ×3 IMPLANT
MODULE NVM5 NEXT GEN EMG (NEEDLE) ×3 IMPLANT
NEEDLE 22X1 1/2 (OR ONLY) (NEEDLE) ×3 IMPLANT
NEEDLE I-PASS III (NEEDLE) ×3 IMPLANT
NEEDLE SPNL 18GX3.5 QUINCKE PK (NEEDLE) ×3 IMPLANT
NS IRRIG 1000ML POUR BTL (IV SOLUTION) ×3 IMPLANT
PACK LAMINECTOMY ORTHO (CUSTOM PROCEDURE TRAY) ×3 IMPLANT
PACK UNIVERSAL I (CUSTOM PROCEDURE TRAY) ×3 IMPLANT
PAD ARMBOARD 7.5X6 YLW CONV (MISCELLANEOUS) IMPLANT
PATTIES SURGICAL .5 X.5 (GAUZE/BANDAGES/DRESSINGS) ×3 IMPLANT
PATTIES SURGICAL .5 X1 (DISPOSABLE) IMPLANT
POSITIONER HEAD PRONE TRACH (MISCELLANEOUS) ×3 IMPLANT
PROBE BALL TIP NVM5 SNG USE (BALLOONS) ×3 IMPLANT
ROD POST TI 5.5X110 (Rod) ×3 IMPLANT
ROD RELINE MAS LORD 5.5X100MM (Rod) ×3 IMPLANT
SCREW LOCK RELINE 5.5 TULIP (Screw) ×24 IMPLANT
SCREW RED RELINE 7.5X45MM POLY (Screw) ×18 IMPLANT
SCREW RELINE MAS RED 7.5X50MM (Screw) ×3 IMPLANT
SCREW RELINE RED 6.5X45MM POLY (Screw) ×6 IMPLANT
SPONGE LAP 4X18 RFD (DISPOSABLE) ×3 IMPLANT
SPONGE SURGIFOAM ABS GEL 100 (HEMOSTASIS) ×3 IMPLANT
SURGIFLO W/THROMBIN 8M KIT (HEMOSTASIS) ×6 IMPLANT
SUT BONE WAX W31G (SUTURE) ×3 IMPLANT
SUT MNCRL AB 3-0 PS2 18 (SUTURE) ×6 IMPLANT
SUT VIC AB 1 CT1 18XCR BRD 8 (SUTURE) ×2 IMPLANT
SUT VIC AB 1 CT1 8-18 (SUTURE) ×4
SUT VIC AB 1 CTX 36 (SUTURE)
SUT VIC AB 1 CTX36XBRD ANBCTR (SUTURE) IMPLANT
SUT VIC AB 2-0 CT1 18 (SUTURE) ×3 IMPLANT
SYR BULB IRRIGATION 50ML (SYRINGE) ×3 IMPLANT
SYR CONTROL 10ML LL (SYRINGE) ×3 IMPLANT
TOWEL GREEN STERILE (TOWEL DISPOSABLE) ×3 IMPLANT
TOWEL GREEN STERILE FF (TOWEL DISPOSABLE) ×3 IMPLANT
TRAY FOLEY MTR SLVR 16FR STAT (SET/KITS/TRAYS/PACK) IMPLANT
WATER STERILE IRR 1000ML POUR (IV SOLUTION) IMPLANT
YANKAUER SUCT BULB TIP NO VENT (SUCTIONS) ×3 IMPLANT

## 2019-06-14 NOTE — Anesthesia Procedure Notes (Signed)
Arterial Line Insertion Start/End2/08/2019 7:05 AM Performed by: Kaylyn Layer, MD, Ezekiel Ina, CRNA, CRNA  Patient location: Pre-op. Preanesthetic checklist: patient identified, IV checked, site marked, risks and benefits discussed, surgical consent, monitors and equipment checked, pre-op evaluation, timeout performed and anesthesia consent Lidocaine 1% used for infiltration Right, radial was placed Catheter size: 20 G Hand hygiene performed  and maximum sterile barriers used   Attempts: 1 Procedure performed without using ultrasound guided technique. Following insertion, dressing applied and Biopatch. Post procedure assessment: normal and unchanged  Patient tolerated the procedure well with no immediate complications.

## 2019-06-14 NOTE — Transfer of Care (Signed)
Immediate Anesthesia Transfer of Care Note  Patient: Cameron Curry  Procedure(s) Performed: POSTERIOR LUMBAR FUSION L2-L5 (N/A )  Patient Location: PACU  Anesthesia Type:General  Level of Consciousness: alert , oriented and patient cooperative  Airway & Oxygen Therapy: Patient Spontanous Breathing and Patient connected to nasal cannula oxygen  Post-op Assessment: Report given to RN, Post -op Vital signs reviewed and stable and Patient moving all extremities X 4  Post vital signs: stable  Last Vitals:  Vitals Value Taken Time  BP    Temp    Pulse    Resp    SpO2      Last Pain:  Vitals:   06/14/19 0655  TempSrc:   PainSc: 6       Patients Stated Pain Goal: 3 (06/14/19 0655)  Complications: No apparent anesthesia complications

## 2019-06-14 NOTE — Progress Notes (Signed)
    Subjective: Procedure(s) (LRB): POSTERIOR LUMBAR FUSION L2-L5 (N/A) Day of Surgery  Patient reports pain as 4 on 0-10 scale.  Reports decreased leg pain reports incisional back pain   Foley in place Negative bowel movement Positive flatus Negative chest pain or shortness of breath  Objective: Vital signs in last 24 hours: Temp:  [97.8 F (36.6 C)-98.7 F (37.1 C)] 98.3 F (36.8 C) (02/04 0444) Pulse Rate:  [80-96] 86 (02/04 0444) Resp:  [16-24] 20 (02/04 0444) BP: (126-162)/(75-94) 133/76 (02/04 0444) SpO2:  [88 %-97 %] 97 % (02/04 0444) Arterial Line BP: (154-159)/(77-78) 159/77 (02/03 1530) Weight:  [120.9 kg] 120.9 kg (02/04 0655)  Intake/Output from previous day: 02/03 0701 - 02/04 0700 In: 3900 [I.V.:3250; Blood:150; IV Piggyback:500] Out: 3500 [Urine:3100; Blood:400]  Labs: Recent Labs    06/11/19 1151  WBC 7.5  RBC 5.17  HCT 50.5  PLT 178   Recent Labs    06/11/19 1151  NA 140  K 4.3  CL 107  CO2 23  BUN 21  CREATININE 1.18  GLUCOSE 94  CALCIUM 9.0   Recent Labs    06/11/19 1151  INR 1.1    Physical Exam: Neurologically intact Intact pulses distally Dorsiflexion/Plantar flexion intact Incision: dressing C/D/I Compartment soft Body mass index is 37.17 kg/m.   Assessment/Plan: Patient stable  xrays n/a Continue mobilization with physical therapy Continue care  Patient status post anterior interbody fusion L2-5.  Patient was ambulating yesterday and noted improved pain control in his legs.  Lumbar radicular pain is improved status post interbody fixation.  As result we will plan on moving forward with posterior supplemental instrumentation.  I do not think formal decompression is required since his radicular leg pain has improved.  I discussed this with the patient and he is in agreement.  We have again gone over the risks benefits and alternatives to surgery and all his questions were addressed.  Venita Lick, MD [facility] [phone]

## 2019-06-14 NOTE — Evaluation (Signed)
Physical Therapy Evaluation Patient Details Name: Cameron Curry MRN: [id number] DOB: 1951/05/02 Today's Date: 06/14/2019   History of Present Illness  Pt is a 69 y/o male who presents s/p oblique interbody fusion L4-L5 and lateral interbody fusion L2-L4 on 06/13/2019, and posterior supplemental pedical screw fixation L2-L5 on 06/14/2019.   Clinical Impression  Pt admitted with above diagnosis. At the time of PT eval, pt was able to demonstrate transfers and ambulation with gross min guard assist to min assist with IV pole for support. Pt was educated on precautions, brace application/wearing schedule, appropriate activity progression, and car transfer. He reports feeling "fuzzy" with blurred vision while ambulating in the hallway and wanted to continue walking, however PT ended gait training for vital signs which were stable upon return to room. Pt currently with functional limitations due to the deficits listed below (see PT Problem List). Pt will benefit from skilled PT to increase their independence and safety with mobility to allow discharge to the venue listed below.      Follow Up Recommendations No PT follow up;Supervision for mobility/OOB    Equipment Recommendations  None recommended by PT    Recommendations for Other Services       Precautions / Restrictions Precautions Precautions: Fall;Back Precaution Booklet Issued: Yes (comment) Precaution Comments: Reviewed handout briefly and pt was cued for precautions during functional mobility. Will need reinforcement of precautions.  Required Braces or Orthoses: Spinal Brace Spinal Brace: Lumbar corset;Applied in sitting position Restrictions Weight Bearing Restrictions: No      Mobility  Bed Mobility Overal bed mobility: Needs Assistance Bed Mobility: Rolling;Sidelying to Sit;Sit to Sidelying Rolling: Supervision Sidelying to sit: Min guard     Sit to sidelying: Min assist General bed mobility comments: Assist to elevate LE's  back into bed at end of session due to pain.   Transfers Overall transfer level: Needs assistance Equipment used: None Transfers: Sit to/from Stand Sit to Stand: Min guard         General transfer comment: Close guard for safety as pt powered up to full standing position.   Ambulation/Gait Ambulation/Gait assistance: Min guard Gait Distance (Feet): 375 Feet Assistive device: IV Pole Gait Pattern/deviations: Step-through pattern;Decreased stride length;Trunk flexed Gait velocity: Decreased Gait velocity interpretation: <1.8 ft/sec, indicate of risk for recurrent falls General Gait Details: IV pole for support - pt occasionally reaching out for railing, however no gross unsteadiness or LOB noted throughout gait training.   Stairs            Wheelchair Mobility    Modified Rankin (Stroke Patients Only)       Balance Overall balance assessment: Needs assistance Sitting-balance support: Feet supported;No upper extremity supported Sitting balance-Leahy Scale: Fair     Standing balance support: No upper extremity supported;During functional activity Standing balance-Leahy Scale: Fair                               Pertinent Vitals/Pain Pain Assessment: Faces Faces Pain Scale: Hurts even more Pain Location: Incision sites Pain Descriptors / Indicators: Operative site guarding;Sore Pain Intervention(s): Limited activity within patient's tolerance;Monitored during session;Repositioned    Home Living Family/patient expects to be discharged to:: Private residence Living Arrangements: Spouse/significant other Available Help at Discharge: Family;Available 24 hours/day Type of Home: House Home Access: Level entry     Home Layout: Two level;Able to live on main level with bedroom/bathroom Home Equipment: Cane - single point;Crutches;Shower seat;Grab bars - tub/shower  Prior Function Level of Independence: Independent               Hand  Dominance        Extremity/Trunk Assessment   Upper Extremity Assessment Upper Extremity Assessment: Overall WFL for tasks assessed    Lower Extremity Assessment Lower Extremity Assessment: Generalized weakness(Consistent with pre-op diagnosis)    Cervical / Trunk Assessment Cervical / Trunk Assessment: Other exceptions Cervical / Trunk Exceptions: s/p surgery  Communication   Communication: No difficulties  Cognition Arousal/Alertness: Awake/alert Behavior During Therapy: WFL for tasks assessed/performed Overall Cognitive Status: Within Functional Limits for tasks assessed                                        General Comments      Exercises     Assessment/Plan    PT Assessment Patient needs continued PT services  PT Problem List Decreased strength;Decreased activity tolerance;Decreased balance;Decreased mobility;Decreased knowledge of use of DME;Decreased safety awareness;Decreased knowledge of precautions;Pain       PT Treatment Interventions DME instruction;Gait training;Stair training;Functional mobility training;Therapeutic activities;Therapeutic exercise;Neuromuscular re-education;Patient/family education    PT Goals (Current goals can be found in the Care Plan section)  Acute Rehab PT Goals Patient Stated Goal: Decrease pain PT Goal Formulation: With patient Time For Goal Achievement: 06/21/19 Potential to Achieve Goals: Good    Frequency Min 5X/week   Barriers to discharge        Co-evaluation               AM-PAC PT "6 Clicks" Mobility  Outcome Measure Help needed turning from your back to your side while in a flat bed without using bedrails?: None Help needed moving from lying on your back to sitting on the side of a flat bed without using bedrails?: A Little Help needed moving to and from a bed to a chair (including a wheelchair)?: None Help needed standing up from a chair using your arms (e.g., wheelchair or bedside  chair)?: None Help needed to walk in hospital room?: A Little Help needed climbing 3-5 steps with a railing? : A Little 6 Click Score: 21    End of Session Equipment Utilized During Treatment: Gait belt;Back brace Activity Tolerance: Patient tolerated treatment well Patient left: in bed;with call bell/phone within reach Nurse Communication: Mobility status PT Visit Diagnosis: Unsteadiness on feet (R26.81);Pain Pain - part of body: (back/abdomen)    Time: 1415-1440 PT Time Calculation (min) (ACUTE ONLY): 25 min   Charges:   PT Evaluation $PT Eval Moderate Complexity: 1 Mod PT Treatments $Gait Training: 8-22 mins        Rolinda Roan, PT, DPT Acute Rehabilitation Services Pager: [phone] Office: [phone]   Thelma Comp 06/14/2019, 3:10 PM

## 2019-06-14 NOTE — Discharge Instructions (Signed)

## 2019-06-14 NOTE — Brief Op Note (Signed)
06/13/2019 - 06/14/2019  9:42 AM  PATIENT:  Cameron Curry  69 y.o. male  PRE-OPERATIVE DIAGNOSIS:  Degenerative scoliosis with spinal stenosis  POST-OPERATIVE DIAGNOSIS:  Degenerative scoliosis with spinal stenosis  PROCEDURE:  Procedure(s) with comments: POSTERIOR LUMBAR FUSION L2-L5 (N/A) - 4 hrs, percutaneous   SURGEON:  Surgeon(s) and Role:    Venita Lick, MD - Primary  PHYSICIAN ASSISTANT:   ASSISTANTS: Amanda Ward, PA   ANESTHESIA:   general  EBL:  150 mL   BLOOD ADMINISTERED:none  DRAINS: none   LOCAL MEDICATIONS USED:  MARCAINE     SPECIMEN:  No Specimen  DISPOSITION OF SPECIMEN:  N/A  COUNTS:  YES  TOURNIQUET:  * No tourniquets in log *  DICTATION: .Dragon Dictation  PLAN OF CARE: Admit to inpatient   PATIENT DISPOSITION:  PACU - hemodynamically stable.

## 2019-06-14 NOTE — Progress Notes (Signed)
PT Cancellation Note  Patient Details Name: Garlin Batdorf MRN: [id number] DOB: May 07, 1951   Cancelled Treatment:    Reason Eval/Treat Not Completed: Patient at procedure or test/unavailable. Noted pt currently in surgery. PT will continue to follow and initiate evaluation post-op as able.    Marylynn Pearson 06/14/2019, 7:51 AM   Conni Slipper, PT, DPT Acute Rehabilitation Services Pager: [phone] Office: [phone]

## 2019-06-14 NOTE — Op Note (Signed)
Operative report  Preoperative diagnosis: degenerative lumbar scoliosis with spinal stenosis and radicular leg pain.  Status post interbody fusion L2-5.  Postoperative diagnosis: Same  Operative procedure: Posterior supplemental pedicle screw fixation L2-5  Complications: None  First Assistant: Glynis Smiles, PA  Implants: NuVasive MIS pedicle screw fixation:   L2: 6.5 x 45 mm length bilaterally.  L3: Left: 7.5 x 50 mm length.  Right: 7.5 x 45 mm length  L4: 7.5 x 45 mm length  L5: 7.5 x 45 mm length  Left rod: 100 mm length, 5.5 diameter  Right rod: 110 mm length, 5.5 diameter  Neuro monitoring: All pedicle screws were directly stimulated and there was no adverse activity at greater than 40 mA.  Throughout the case there were no adverse SSEP or free running EMG activity.  Neuro monitoring remained normal.  Indications: Cameron Curry is a pleasant 69 year old gentleman who underwent interbody fixation L2-5 yesterday without complication.  This morning he indicates that he was able to stand and walk without significant radicular leg pain.  He did complain of abdominal/incisional pain but overall he felt improved.  As result of the reduction in his radicular leg pain I elected to proceed with just the instrumented supplemented fusion.  I did not feel as though additional decompression was required.  I discussed this with the patient and he agreed with the surgical plan.  Operative note: Patient was brought the operating room placed upon the operating room table.  After successful induction of general anesthesia endotracheal ovation teds SCDs were applied and he was turned prone onto the [facility] spine frame.  All bony prominences well-padded and the back was prepped and draped in a standard fashion.  Timeout was taken to confirm patient procedure and all other important data.  Using fluoroscopic guidance identified the lateral borders of the L2-3-4 and 5 pedicles I marked this out and then infiltrated  the area with quarter percent Marcaine with epinephrine.  Small stab incisions were made and I advanced the Jamshidi needle down percutaneously to the lateral aspect of the L2 pedicle.  Once properly positioned I advanced the Jamshidi needle into the pedicle.  Using both fluoroscopic guidance and neuro monitoring I advanced the Jamshidi needle into the L2 pedicle.  There were no adverse free running EMG activity noted.  As I neared the medial wall of the pedicle on the AP view I switched the fluoroscopy to the lateral view to confirm that I was just beyond the posterior wall the vertebral body.  Having confirmed trajectory and position I advanced the Jamshidi needle into the vertebral body.  I then placed the guidepin in order to cannulate the pedicle of L2.  Using this exact same technique I cannulated the L3-L4 and L5 pedicles on the right side.  Again had no point time where there any adverse free running EMG activity to suggest neural injury and all imaging studies demonstrate satisfactory position and trajectory of the Jamshidi needle during insertion.  I then went to the contralateral side and using the same technique cannulated the left L2-5 pedicles.  Once I confirmed all 8 pedicles were properly cannulated I then measured and placed the appropriate size pedicle screw.  Again while inserting the pedicle screw I directly stimulated and there was no adverse free running EMG activity.  The pedicle screw was placed over the guidepin and advanced down to the appropriate depth.  Once all 8 pedicle screws were in place I then directly stimulated each of the pedicle heads and there  is no adverse activity at greater than 40 mA.  I then measured the construct and obtained the appropriate size rod.  The rod was advanced through each of the pedicle heads and pushed down to the base.  I then rotated each of the screws to ensure that the rod was properly positioned.  The locking caps were then inserted.  Once all the  locking caps were in place I tightened them using the torque device.  They were all secured according manufacturer's standards.  At this point once both rods were properly situated and all locking caps were torqued off according to manufacture standards I then removed the insertional tabs.  At this point the patient had four small incisions lateral incisions to accommodate insertion of the pedicle screws (2 on either side).  All 4 wounds were copiously irrigated with normal saline and hemostasis was obtained using bipolar electrocautery, and FloSeal.  The deep fascia was reapproximated with interrupted #1 Vicryl sutures then superficial 2-0 Vicryl suture and then 3-0 Monocryl for the skin.  Steri-Strips and dry dressings were applied and the patient was ultimately extubated transfer the PACU without incident.  The end of the case all needle and sponge counts were correct.  There were no adverse intraoperative events.

## 2019-06-14 NOTE — Progress Notes (Signed)
OT Cancellation Note  Patient Details Name: Pheng Prokop MRN: [id number] DOB: 21-Apr-1951   Cancelled Treatment:    Reason Eval/Treat Not Completed: Patient at procedure or test/ unavailable. Will follow and see as able.   Barry Brunner, OT Acute Rehabilitation Services Pager [phone] Office [phone]   Chancy Milroy 06/14/2019, 7:13 AM

## 2019-06-14 NOTE — Anesthesia Procedure Notes (Signed)
Procedure Name: Intubation Date/Time: 06/14/2019 7:35 AM Performed by: Milford Cage, CRNA Pre-anesthesia Checklist: Patient identified, Emergency Drugs available, Suction available, Patient being monitored and Timeout performed Patient Re-evaluated:Patient Re-evaluated prior to induction Oxygen Delivery Method: Circle System Utilized Preoxygenation: Pre-oxygenation with 100% oxygen Induction Type: IV induction Ventilation: Two handed mask ventilation required, Mask ventilation without difficulty and Oral airway inserted - appropriate to patient size Laryngoscope Size: Mac and 4 Grade View: Grade I Tube type: Oral Tube size: 7.5 mm Number of attempts: 1 Airway Equipment and Method: Stylet and Oral airway Placement Confirmation: ETT inserted through vocal cords under direct vision,  positive ETCO2 and breath sounds checked- equal and bilateral Secured at: 23 cm Tube secured with: Tape Dental Injury: Teeth and Oropharynx as per pre-operative assessment

## 2019-06-14 NOTE — Anesthesia Postprocedure Evaluation (Signed)
Anesthesia Post Note  Patient: Cameron Curry  Procedure(s) Performed: POSTERIOR LUMBAR FUSION L2-L5 (N/A )     Patient location during evaluation: PACU Anesthesia Type: General Level of consciousness: awake and alert and oriented Pain management: pain level controlled Vital Signs Assessment: post-procedure vital signs reviewed and stable Respiratory status: spontaneous breathing, nonlabored ventilation and respiratory function stable Cardiovascular status: blood pressure returned to baseline Postop Assessment: no apparent nausea or vomiting Anesthetic complications: no    Last Vitals:  Vitals:   06/14/19 1145 06/14/19 1215  BP:  134/74  Pulse:  80  Resp:  20  Temp: 36.9 C 36.6 C  SpO2:  97%    Last Pain:  Vitals:   06/14/19 1300  TempSrc:   PainSc: 8                  Kaylyn Layer

## 2019-06-15 ENCOUNTER — Encounter: Payer: Self-pay | Admitting: *Deleted

## 2019-06-15 MED ORDER — MAGNESIUM CITRATE PO SOLN
1.0000 | Freq: Once | ORAL | Status: AC
  Administered 2019-06-15: 01:00:00 1 via ORAL
  Filled 2019-06-15: qty 296

## 2019-06-15 MED ORDER — OXYCODONE-ACETAMINOPHEN 5-325 MG PO TABS
1.0000 | ORAL_TABLET | ORAL | Status: DC | PRN
  Administered 2019-06-15 – 2019-06-17 (×11): 2 via ORAL
  Filled 2019-06-15 (×12): qty 2

## 2019-06-15 MED FILL — Thrombin (Recombinant) For Soln 20000 Unit: CUTANEOUS | Qty: 1 | Status: AC

## 2019-06-15 NOTE — Progress Notes (Addendum)
Subjective: 1 Day Post-Op Procedure(s) (LRB): POSTERIOR LUMBAR FUSION L2-L5 (N/A), L4-5 OLIF, L2-3, L3-4 XLIF Patient reports pain as mild.  Complains of pain with movement (up out of bed). Resolution of leg pain. +void, +flatus, -BM. Tolerating PO w/o N/V. Denies CP, Calf pain, positional HA, dizziness.   Objective: Vital signs in last 24 hours: Temp:  [97.6 F (36.4 C)-98.4 F (36.9 C)] 98.4 F (36.9 C) (02/05 0754) Pulse Rate:  [74-85] 75 (02/05 0754) Resp:  [18-20] 18 (02/05 0754) BP: (108-144)/(63-81) 111/79 (02/05 0754) SpO2:  [93 %-98 %] 94 % (02/05 0754)  Intake/Output from previous day: 02/04 0701 - 02/05 0700 In: 1190 [P.O.:240; I.V.:850; IV Piggyback:100] Out: 975 [Urine:825; Blood:150] Intake/Output this shift: No intake/output data recorded.  No results for input(s): HGB in the last 72 hours. No results for input(s): WBC, RBC, HCT, PLT in the last 72 hours. No results for input(s): NA, K, CL, CO2, BUN, CREATININE, GLUCOSE, CALCIUM in the last 72 hours. No results for input(s): LABPT, INR in the last 72 hours.  Neurologically intact ABD soft Neurovascular intact Sensation intact distally Intact pulses distally Dorsiflexion/Plantar flexion intact Incision: scant drainage No cellulitis present Compartment soft   Assessment/Plan: 1 Day Post-Op Procedure(s) (LRB): POSTERIOR LUMBAR FUSION L2-L5 (N/A) Advance diet Up with therapy DVT PPx: No SubQ heparin. TEDs, SCDs, ambulation Encourage IS  Plan D/C tomorrow.    Leonette Monarch Ward 06/15/2019, 12:19 PM

## 2019-06-15 NOTE — TOC Initial Note (Signed)
Transition of Care [facility]) - Initial/Assessment Note    Patient Details  Name: Cameron Curry MRN: [id number] Date of Birth: 1950-11-22  Transition of [facility]) CM/SW Contact:    Ella Bodo, RN Phone Number: 06/15/2019, 4:49 PM  Clinical Narrative:  Pt is a 69 y/o male who presents s/p oblique interbody fusion L4-L5 and lateral interbody fusion L2-L4 on 06/13/2019, and posterior supplemental pedical screw fixation L2-L5 on 06/14/2019.   PTA, pt independent, lives at home with spouse, who can offer little support due to her own medical issues.  HHPT ordered by MD, and pt states he prefers to have them come out at least for 1-2 visits.  Referral to [facility] for [facility] follow up, per pt choice.  RW and 3 in 1 ordered and will be given to pt from [facility] floor stock.                 Expected Discharge Plan: [place] Barriers to Discharge: Continued Medical Work up   Patient Goals and CMS Choice   CMS Medicare.gov Compare Post Acute Care list provided to:: Patient Choice offered to / list presented to : Patient  Expected Discharge Plan and Services Expected Discharge Plan: [place]   Discharge Planning Services: CM Consult Post Acute Care Choice: [place] arrangements for the past 2 months: Single Family Home                 DME Arranged: 3-N-1, Walker rolling DME Agency: [employer] Date DME Agency Contacted: 06/15/19 Time DME Agency Contacted: 1000 Representative spoke with at DME Agency: DME on floor HH Arranged: PT [place]: [facility] [place], [place]) Date [place]: 06/15/19 Time [place]: [phone] Representative spoke with at [place]: [place]  Prior Living Arrangements/Services Living arrangements for the past 2 months: [place] with:: Spouse Patient language and need for interpreter reviewed:: Yes Do you feel safe going back to the place where you live?: Yes      Need for Family  Participation in Patient Care: Yes (Comment) Care giver support system in place?: Yes (comment)   Criminal Activity/Legal Involvement Pertinent to Current Situation/Hospitalization: Yes - Comment as needed  Activities of Daily Living Home Assistive Devices/Equipment: Crutches, Cane (specify quad or straight) ADL Screening (condition at time of admission) Patient's cognitive ability adequate to safely complete daily activities?: Yes 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) Does the patient have difficulty seeing, even when wearing glasses/contacts?: No Does the patient have difficulty concentrating, remembering, or making decisions?: No Patient able to express need for assistance with ADLs?: Yes Does the patient have difficulty dressing or bathing?: No Independently performs ADLs?: No Communication: Independent Dressing (OT): Needs assistance Is this a change from baseline?: Change from baseline, expected to last <3days Grooming: Independent Feeding: Independent Bathing: Needs assistance Is this a change from baseline?: Change from baseline, expected to last <3 days Toileting: Needs assistance Is this a change from baseline?: Change from baseline, expected to last <3 days In/Out Bed: Needs assistance Is this a change from baseline?: Change from baseline, expected to last <3 days Does the patient have difficulty walking or climbing stairs?: Yes Weakness of Legs: Both Weakness of Arms/Hands: None  Permission Sought/Granted   Permission granted to share information with : Yes, Verbal Permission Granted     Permission granted to share info w [place]: [place]  Emotional Assessment Appearance:: Appears stated age Attitude/Demeanor/Rapport: Engaged Affect (typically observed): Accepting Orientation: : Oriented to Self, Oriented to Place, Oriented to  Time, Oriented to Situation Alcohol / Substance Use: Not Applicable Psych Involvement: No  (comment)  Admission diagnosis:  S/P lumbar fusion [Z98.1] Patient Active Problem List   Diagnosis Date Noted  . S/P lumbar fusion 06/13/2019   PCP:  Arma Heading, MD Pharmacy:   [facility] [phone] - [place], [place] - [address] AT [address] of [address] [place] [address] Phone: [phone] Fax: [phone]     Social Determinants of Health (SDOH) Interventions    Readmission Risk Interventions No flowsheet data found.  Quintella Baton, RN, BSN  Trauma/Neuro ICU Case Manager [phone]

## 2019-06-15 NOTE — Progress Notes (Signed)
  Progress Note    06/15/2019 8:49 AM 1 Day Post-Op  Subjective:  Minimal pain. States pain is mostly deep muscle pain on left side and lower back when getting in and out of bed or when trying to twist to lay down. Denies any pain in his legs. Up ambulating in the hall with walker when seen on floor, able to ambulate in room without walker  Vitals:   06/15/19 0410 06/15/19 0754  BP: 136/75 111/79  Pulse: 76 75  Resp: 20 18  Temp: 97.8 F (36.6 C) 98.4 F (36.9 C)  SpO2: 98% 94%   Physical Exam: Lungs: non labored Incisions:  left oblique dressings clean, dry and intact. Minimal dried bloody drainage from lumbar incisions Extremities: Without edema, 2+ distal pulses bilaterally, intact dorsiflexion/plantar flexion, normal strength and sensation Abdomen:  Soft, non distended Neurologic: appropriate affect  CBC    Component Value Date/Time   WBC 7.5 06/11/2019 1151   RBC 5.17 06/11/2019 1151   HGB 17.3 (H) 06/11/2019 1151   HCT 50.5 06/11/2019 1151   PLT 178 06/11/2019 1151   MCV 97.7 06/11/2019 1151   MCH 33.5 06/11/2019 1151   MCHC 34.3 06/11/2019 1151   RDW 11.9 06/11/2019 1151    BMET    Component Value Date/Time   NA 140 06/11/2019 1151   K 4.3 06/11/2019 1151   CL 107 06/11/2019 1151   CO2 23 06/11/2019 1151   GLUCOSE 94 06/11/2019 1151   BUN 21 06/11/2019 1151   CREATININE 1.18 06/11/2019 1151   CALCIUM 9.0 06/11/2019 1151   GFRNONAA >60 06/11/2019 1151   GFRAA >60 06/11/2019 1151    INR    Component Value Date/Time   INR 1.1 06/11/2019 1151     Intake/Output Summary (Last 24 hours) at 06/15/2019 0849 Last data filed at 06/14/2019 1630 Gross per 24 hour  Intake 1140 ml  Output 450 ml  Net 690 ml     Assessment/Plan:  69 y.o. male is s/p posterior supplemental pedical screw fixation L2-5 1 Day Post-Op and closure of L4-5 with oblique exposure 2 Day post-op. Patient is doing well. No incisional issues. Up ambulating with PT. Follow up with vascular  as needed if any incision concerns  DVT prophylaxis:  Sq heparin   Graceann Congress, PA-C [facility] [phone] 06/15/2019 8:49 AM

## 2019-06-15 NOTE — Progress Notes (Signed)
Physical Therapy Treatment Patient Details Name: Reason Helzer MRN: [id number] DOB: 16-Nov-1950 Today's Date: 06/15/2019    History of Present Illness Pt is a 69 y/o male who presents s/p oblique interbody fusion L4-L5 and lateral interbody fusion L2-L4 on 06/13/2019, and posterior supplemental pedical screw fixation L2-L5 on 06/14/2019.     PT Comments    Pt progressing well with post-op mobility. He was able to demonstrate transfers and ambulation with gross modified independence with RW for support. Pt was educated on precautions, brace application/wearing schedule, appropriate activity progression, and car transfer. He has met acute PT goals and we will sign off at this time. If needs change, please reconsult.    Follow Up Recommendations  No PT follow up;Supervision for mobility/OOB     Equipment Recommendations  None recommended by PT    Recommendations for Other Services       Precautions / Restrictions Precautions Precautions: Fall;Back Precaution Booklet Issued: Yes (comment) Precaution Comments: educated in back precautions during mobility and ADL Required Braces or Orthoses: Spinal Brace Spinal Brace: Lumbar corset;Applied in sitting position Restrictions Weight Bearing Restrictions: No    Mobility  Bed Mobility Overal bed mobility: Needs Assistance Bed Mobility: Rolling;Sidelying to Sit;Sit to Sidelying Rolling: Supervision Sidelying to sit: Supervision     Sit to sidelying: Min assist General bed mobility comments: Pt was received exiting bathroom with NT present.   Transfers Overall transfer level: Modified independent Equipment used: None Transfers: Sit to/from Stand Sit to Stand: Supervision         General transfer comment: No assist required. Pt was able to power-up to full stand without assist and demonstrate good control to bedside.   Ambulation/Gait Ambulation/Gait assistance: Modified independent (Device/Increase time) Gait Distance (Feet):  600 Feet Assistive device: None;Rolling walker (2 wheeled) Gait Pattern/deviations: Step-through pattern;Decreased stride length;Trunk flexed Gait velocity: Decreased Gait velocity interpretation: <1.8 ft/sec, indicate of risk for recurrent falls General Gait Details: Pt ambulating in room without AD but utilized RW for hallway ambulation. Mod I with RW.    Stairs             Wheelchair Mobility    Modified Rankin (Stroke Patients Only)       Balance Overall balance assessment: Needs assistance Sitting-balance support: Feet supported;No upper extremity supported Sitting balance-Leahy Scale: Good     Standing balance support: No upper extremity supported;During functional activity Standing balance-Leahy Scale: Fair                              Cognition Arousal/Alertness: Awake/alert Behavior During Therapy: WFL for tasks assessed/performed Overall Cognitive Status: Within Functional Limits for tasks assessed                                        Exercises      General Comments        Pertinent Vitals/Pain Pain Assessment: Faces Faces Pain Scale: Hurts little more Pain Location: Incision sites; abdomen and back Pain Descriptors / Indicators: Operative site guarding;Sore;Grimacing;Guarding Pain Intervention(s): Limited activity within patient's tolerance;Monitored during session;Repositioned    Home Living Family/patient expects to be discharged to:: Private residence Living Arrangements: Spouse/significant other Available Help at Discharge: Family;Available 24 hours/day Type of Home: House Home Access: Level entry   Home Layout: Two level;Able to live on main level with bedroom/bathroom Home Equipment: Cane - single point;Crutches;Shower  seat;Grab bars - tub/shower;Adaptive equipment      Prior Function Level of Independence: Independent          PT Goals (current goals can now be found in the care plan section) Acute  Rehab PT Goals Patient Stated Goal: Decrease pain PT Goal Formulation: With patient Time For Goal Achievement: 06/21/19 Potential to Achieve Goals: Good Progress towards PT goals: Progressing toward goals    Frequency    Min 5X/week      PT Plan Current plan remains appropriate    Co-evaluation              AM-PAC PT "6 Clicks" Mobility   Outcome Measure  Help needed turning from your back to your side while in a flat bed without using bedrails?: None Help needed moving from lying on your back to sitting on the side of a flat bed without using bedrails?: A Little Help needed moving to and from a bed to a chair (including a wheelchair)?: None Help needed standing up from a chair using your arms (e.g., wheelchair or bedside chair)?: None Help needed to walk in hospital room?: A Little Help needed climbing 3-5 steps with a railing? : A Little 6 Click Score: 21    End of Session Equipment Utilized During Treatment: Gait belt;Back brace Activity Tolerance: Patient tolerated treatment well Patient left: Other (comment)(Sitting EOB with PA present) Nurse Communication: Mobility status PT Visit Diagnosis: Unsteadiness on feet (R26.81);Pain Pain - part of body: (back/abdomen)     Time: 2567-2091 PT Time Calculation (min) (ACUTE ONLY): 15 min  Charges:  $Gait Training: 8-22 mins                     Rolinda Roan, PT, DPT Acute Rehabilitation Services Pager: [phone] Office: [phone]    Thelma Comp 06/15/2019, 3:11 PM

## 2019-06-15 NOTE — Evaluation (Signed)
Occupational Therapy Evaluation Patient Details Name: Cameron Curry MRN: [id number] DOB: April 02, 1951 Today's Date: 06/15/2019    History of Present Illness Pt is a 69 y/o male who presents s/p oblique interbody fusion L4-L5 and lateral interbody fusion L2-L4 on 06/13/2019, and posterior supplemental pedical screw fixation L2-L5 on 06/14/2019.    Clinical Impression   Pt was functioning independently prior to admission. Currently requiring min assist for bed mobility, supervision for mobility in room without a device and min assist for LB ADL. Pt's wife can offer minimal assistance. Pt owns a reacher and long handled bath sponge, recommended a reacher. Pt educated at length in IADL to avoid and back precautions during ADL using compensatory strategies. Will follow acutely.    Follow Up Recommendations  No OT follow up    Equipment Recommendations  3 in 1 bedside commode    Recommendations for Other Services       Precautions / Restrictions Precautions Precautions: Fall;Back Precaution Comments: educated in back precautions during mobility and ADL Required Braces or Orthoses: Spinal Brace Spinal Brace: Lumbar corset;Applied in sitting position Restrictions Weight Bearing Restrictions: No      Mobility Bed Mobility Overal bed mobility: Needs Assistance Bed Mobility: Rolling;Sidelying to Sit;Sit to Sidelying Rolling: Supervision Sidelying to sit: Supervision     Sit to sidelying: Min assist General bed mobility comments: assist for LEs back into bed, heavy reliance on rail, increased time  Transfers Overall transfer level: Needs assistance Equipment used: None Transfers: Sit to/from Stand Sit to Stand: Supervision         General transfer comment: supervision for safety    Balance Overall balance assessment: Needs assistance   Sitting balance-Leahy Scale: Good     Standing balance support: No upper extremity supported;During functional activity Standing  balance-Leahy Scale: Fair                             ADL either performed or assessed with clinical judgement   ADL Overall ADL's : Needs assistance/impaired Eating/Feeding: Independent   Grooming: Supervision/safety;Standing Grooming Details (indicate cue type and reason): educated in two cup method for toothbrushing and use of wash cloth on face Upper Body Bathing: Minimal assistance;Standing Upper Body Bathing Details (indicate cue type and reason): recommended use of long handled bath sponge for back Lower Body Bathing: Supervison/ safety Lower Body Bathing Details (indicate cue type and reason): recommended long handled bath sponge and reacher for washing and drying feet Upper Body Dressing : Set up;Sitting   Lower Body Dressing: Minimal assistance;Sit to/from stand Lower Body Dressing Details (indicate cue type and reason): is interested in sock aid Toilet Transfer: Supervision/safety;Ambulation     Toileting - Clothing Manipulation Details (indicate cue type and reason): recommended use of tongs and wet wipes to avoid twisting with pericare     Functional mobility during ADLs: Supervision/safety General ADL Comments: Educated in Economist and IADL to avoid.     Vision Patient Visual Report: No change from baseline       Perception     Praxis      Pertinent Vitals/Pain Pain Assessment: Faces Faces Pain Scale: Hurts even more Pain Location: Incision sites with bed mobility Pain Descriptors / Indicators: Operative site guarding;Sore;Grimacing;Guarding Pain Intervention(s): Monitored during session;Premedicated before session;Repositioned     Hand Dominance Right   Extremity/Trunk Assessment Upper Extremity Assessment Upper Extremity Assessment: Overall WFL for tasks assessed   Lower Extremity Assessment Lower Extremity Assessment: Defer to PT  evaluation   Cervical / Trunk Assessment Cervical / Trunk Assessment: Other exceptions Cervical /  Trunk Exceptions: s/p surgery   Communication Communication Communication: HOH   Cognition Arousal/Alertness: Awake/alert Behavior During Therapy: WFL for tasks assessed/performed Overall Cognitive Status: Within Functional Limits for tasks assessed                                     General Comments       Exercises     Shoulder Instructions      Home Living Family/patient expects to be discharged to:: Private residence Living Arrangements: Spouse/significant other Available Help at Discharge: Family;Available 24 hours/day Type of Home: House Home Access: Level entry     Home Layout: Two level;Able to live on main level with bedroom/bathroom     Bathroom Shower/Tub: Chief Strategy Officer: Standard     Home Equipment: Cane - single point;Crutches;Shower seat;Grab bars - tub/shower;Adaptive equipment Adaptive Equipment: Reacher;Long-handled sponge        Prior Functioning/Environment Level of Independence: Independent                 OT Problem List:        OT Treatment/Interventions:      OT Goals(Current goals can be found in the care plan section) Acute Rehab OT Goals Patient Stated Goal: Decrease pain OT Goal Formulation: With patient Time For Goal Achievement: 06/22/19 Potential to Achieve Goals: Good ADL Goals Pt Will Perform Lower Body Dressing: with modified independence;with adaptive equipment;sit to/from stand Pt Will Perform Tub/Shower Transfer: with modified independence;ambulating;Tub transfer;grab bars  OT Frequency:     Barriers to D/C:            Co-evaluation              AM-PAC OT "6 Clicks" Daily Activity     Outcome Measure Help from another person eating meals?: None Help from another person taking care of personal grooming?: A Little Help from another person toileting, which includes using toliet, bedpan, or urinal?: A Little Help from another person bathing (including washing, rinsing,  drying)?: A Little Help from another person to put on and taking off regular upper body clothing?: None Help from another person to put on and taking off regular lower body clothing?: A Little 6 Click Score: 20   End of Session Equipment Utilized During Treatment: Back brace  Activity Tolerance: Patient tolerated treatment well Patient left: in bed;with call bell/phone within reach  OT Visit Diagnosis: Unsteadiness on feet (R26.81);Pain                Time: 0347-4259 OT Time Calculation (min): 24 min Charges:  OT General Charges $OT Visit: 1 Visit OT Evaluation $OT Eval Moderate Complexity: 1 Mod OT Treatments $Self Care/Home Management : 8-22 mins  Martie Round, OTR/L Acute Rehabilitation Services Pager: [phone] Office: [phone] Evern Bio 06/15/2019, 12:13 PM

## 2019-06-16 MED ORDER — LEVOTHYROXINE SODIUM 25 MCG PO TABS
50.0000 ug | ORAL_TABLET | Freq: Every day | ORAL | Status: DC
  Administered 2019-06-17: 50 ug via ORAL
  Filled 2019-06-16: qty 2

## 2019-06-16 MED ORDER — METOPROLOL SUCCINATE ER 25 MG PO TB24
25.0000 mg | ORAL_TABLET | Freq: Every day | ORAL | Status: DC
  Administered 2019-06-17: 09:00:00 25 mg via ORAL
  Filled 2019-06-16: qty 1

## 2019-06-16 MED ORDER — ASPIRIN EC 81 MG PO TBEC
81.0000 mg | DELAYED_RELEASE_TABLET | Freq: Every day | ORAL | Status: DC
  Administered 2019-06-17: 09:00:00 81 mg via ORAL
  Filled 2019-06-16: qty 1

## 2019-06-16 MED ORDER — LISINOPRIL 10 MG PO TABS
10.0000 mg | ORAL_TABLET | Freq: Every day | ORAL | Status: DC
  Administered 2019-06-17: 10 mg via ORAL
  Filled 2019-06-16: qty 1

## 2019-06-16 NOTE — Progress Notes (Signed)
Occupational Therapy Treatment Patient Details Name: Cameron Curry MRN: [id number] DOB: 1951/05/03 Today's Date: 06/16/2019    History of present illness Pt is a 69 y/o male who presents s/p oblique interbody fusion L4-L5 and lateral interbody fusion L2-L4 on 06/13/2019, and posterior supplemental pedical screw fixation L2-L5 on 06/14/2019.    OT comments  Patient is s/p see above surgery resulting in the deficits listed below (see OT Problem List). Pt today was educated about use of toilet aide, long handle reach and sock aide to be able to complete ADLS to return home. Pt was able to don and doff brace with supervision about positioning. Patient will benefit from skilled OT to increase their safety and independence with ADL and functional mobility for ADL (while adhering to their precautions) to facilitate discharge to venue listed below.    Follow Up Recommendations  No OT follow up    Equipment Recommendations  3 in 1 bedside commode    Recommendations for Other Services      Precautions / Restrictions Precautions Precautions: Fall;Back Precaution Booklet Issued: Yes (comment) Precaution Comments: educated in back precautions during mobility and ADL Required Braces or Orthoses: Spinal Brace Spinal Brace: Lumbar corset;Applied in sitting position Restrictions Weight Bearing Restrictions: No       Mobility Bed Mobility Overal bed mobility: Needs Assistance Bed Mobility: Rolling;Sidelying to Sit;Sit to Sidelying;Supine to Sit;Sit to Supine Rolling: Supervision Sidelying to sit: Supervision Supine to sit: Min assist;HOB elevated Sit to supine: Min assist;HOB elevated Sit to sidelying: Min assist    Transfers Overall transfer level: Modified independent Equipment used: None Transfers: Sit to/from Stand Sit to Stand: Supervision              Balance Overall balance assessment: Needs assistance Sitting-balance support: Feet supported;No upper extremity  supported Sitting balance-Leahy Scale: Good     Standing balance support: No upper extremity supported;During functional activity Standing balance-Leahy Scale: Fair                             ADL either performed or assessed with clinical judgement   ADL Overall ADL's : Needs assistance/impaired Eating/Feeding: Independent   Grooming: Supervision/safety;Standing Grooming Details (indicate cue type and reason): educated in two cup method for toothbrushing and use of wash cloth on face Upper Body Bathing: Minimal assistance;Standing Upper Body Bathing Details (indicate cue type and reason): recommended use of long handled bath sponge for back Lower Body Bathing: Supervison/ safety Lower Body Bathing Details (indicate cue type and reason): recommended long handled bath sponge and reacher for washing and drying feet Upper Body Dressing : Set up;Sitting   Lower Body Dressing: Minimal assistance;Sit to/from stand   Toilet Transfer: Supervision/safety;Ambulation   Toileting- Clothing Manipulation and Hygiene: Minimal assistance;Sit to/from stand   Tub/ Shower Transfer: Min guard;Cueing for safety;Cueing for sequencing   Functional mobility during ADLs: Supervision/safety General ADL Comments: Educated in Estate manager/land agent and IADL to avoid.     Vision   Vision Assessment?: No apparent visual deficits   Perception     Praxis      Cognition Arousal/Alertness: Awake/alert Behavior During Therapy: WFL for tasks assessed/performed Overall Cognitive Status: Within Functional Limits for tasks assessed                                          Exercises  Shoulder Instructions       General Comments      Pertinent Vitals/ Pain       Pain Assessment: Faces Faces Pain Scale: Hurts little more Pain Location: Incision sites; abdomen and back Pain Descriptors / Indicators: Operative site guarding;Sore;Grimacing;Guarding Pain Intervention(s):  Monitored during session;Repositioned  Home Living                                          Prior Functioning/Environment              Frequency  Min 2X/week        Progress Toward Goals  OT Goals(current goals can now be found in the care plan section)  Progress towards OT goals: Progressing toward goals  Acute Rehab OT Goals Patient Stated Goal: Decrease pain OT Goal Formulation: With patient Time For Goal Achievement: 06/22/19 Potential to Achieve Goals: Good ADL Goals Pt Will Perform Lower Body Dressing: with modified independence;with adaptive equipment;sit to/from stand Pt Will Perform Tub/Shower Transfer: with modified independence;ambulating;Tub transfer;grab bars  Plan Discharge plan remains appropriate    Co-evaluation                 AM-PAC OT "6 Clicks" Daily Activity     Outcome Measure   Help from another person eating meals?: None Help from another person taking care of personal grooming?: A Little Help from another person toileting, which includes using toliet, bedpan, or urinal?: A Little Help from another person bathing (including washing, rinsing, drying)?: A Little Help from another person to put on and taking off regular upper body clothing?: None Help from another person to put on and taking off regular lower body clothing?: A Little 6 Click Score: 20    End of Session Equipment Utilized During Treatment: Back brace  OT Visit Diagnosis: Unsteadiness on feet (R26.81);Pain Pain - part of body: (back)   Activity Tolerance Patient tolerated treatment well   Patient Left Other (comment)(pt going to nurse station)   Nurse Communication          Time: 1443-1540 OT Time Calculation (min): 37 min  Charges: OT General Charges $OT Visit: 1 Visit  Joeseph Amor OTR/L  [place]  [phone] office number [phone] pager number    Joeseph Amor 06/16/2019, 9:10 AM

## 2019-06-16 NOTE — Progress Notes (Signed)
Subjective: 2 Days Post-Op Procedure(s) (LRB): POSTERIOR LUMBAR FUSION L2-L5 (N/A), L4-5 OLIF, L2-3, L3-4 XLIF Patient reports pain as mild.  Patient states that he is doing well. Biggest complaint this morning was pain when he was trying to swing his legs out of bed and sit on the side of it. He has been able to walk with therapy states pain is minimal with this.  Denies leg pain, N/T.  + void, +flatus, +BM Tolerating PO fluids and food with no N/V Denies CP, calf pain or diziness  Objective: Vital signs in last 24 hours: Temp:  [98.2 F (36.8 C)-98.6 F (37 C)] 98.2 F (36.8 C) (02/06 0405) Pulse Rate:  [75-94] 85 (02/06 0405) Resp:  [18-20] 20 (02/06 0405) BP: (111-120)/(62-79) 120/71 (02/06 0405) SpO2:  [92 %-95 %] 95 % (02/06 0405)  Intake/Output from previous day: No intake/output data recorded. Intake/Output this shift: No intake/output data recorded.  No results for input(s): HGB in the last 72 hours. No results for input(s): WBC, RBC, HCT, PLT in the last 72 hours. No results for input(s): NA, K, CL, CO2, BUN, CREATININE, GLUCOSE, CALCIUM in the last 72 hours. No results for input(s): LABPT, INR in the last 72 hours.  Neurologically intact ABD soft Neurovascular intact Sensation intact distally Intact pulses distally Dorsiflexion/Plantar flexion intact Incision: moderate drainage No cellulitis present Compartment soft   Assessment/Plan: 2 Days Post-Op Procedure(s) (LRB): POSTERIOR LUMBAR FUSION L2-L5 (N/A) Advance diet Up with therapy Plan for discharge tomorrow Patient is concerned about going home today due to his wife not being able to help him a lot at home. He would like to stay 1 more night so he gets more comfortable with getting in and out of bed with more confidence. He will work with PT today and plan to go home tomorrow DVT PPx: No SubQ heparin. TEDs, SCDs, ambulation  Plan D/C tomorrow AM   Denman George [facility] [phone] 06/16/2019, 7:29 AM

## 2019-06-17 MED ORDER — METHOCARBAMOL 500 MG PO TABS: 500.0000 mg | ORAL_TABLET | Freq: Three times a day (TID) | ORAL | 0 refills | Status: AC | PRN

## 2019-06-17 MED ORDER — ONDANSETRON HCL 4 MG PO TABS: 4.0000 mg | ORAL_TABLET | Freq: Three times a day (TID) | ORAL | 0 refills | Status: DC | PRN

## 2019-06-17 MED ORDER — METOPROLOL SUCCINATE ER 25 MG PO TB24
25.0000 mg | ORAL_TABLET | ORAL | Status: AC
  Administered 2019-06-17: 25 mg via ORAL

## 2019-06-17 MED ORDER — OXYCODONE-ACETAMINOPHEN 10-325 MG PO TABS: 1.0000 | ORAL_TABLET | Freq: Four times a day (QID) | ORAL | 0 refills | Status: AC | PRN

## 2019-06-17 NOTE — Progress Notes (Addendum)
Occupational Therapy Treatment and Discharge  Patient Details Name: Cameron Curry MRN: [id number] DOB: 28-Jun-1950 Today's Date: 06/17/2019    History of present illness Pt is a 69 y/o male who presents s/p oblique interbody fusion L4-L5 and lateral interbody fusion L2-L4 on 06/13/2019, and posterior supplemental pedical screw fixation L2-L5 on 06/14/2019.    OT comments  Patient progressing well. Reviewed AE and patient completing with supervision (min cueing for techniques to increase ease). Pt reports spouse purchasing AE.  Transfers with intermittent cueing for hand placement (not pulling on RW), and bed mobility with cueing for log roll technique.  Pt with no further questions.  No further OT needs identified.  Goals appropriate for dc.    Follow Up Recommendations  No OT follow up    Equipment Recommendations  3 in 1 bedside commode;Other (comment)(RW)    Recommendations for Other Services      Precautions / Restrictions Precautions Precautions: Fall;Back Precaution Booklet Issued: Yes (comment) Precaution Comments: good adherence and recall of precautions  Required Braces or Orthoses: Spinal Brace Spinal Brace: Lumbar corset;Applied in sitting position Restrictions Weight Bearing Restrictions: No       Mobility Bed Mobility Overal bed mobility: Needs Assistance Bed Mobility: Rolling;Sidelying to Sit Rolling: Supervision Sidelying to sit: Supervision       General bed mobility comments: cueing for log roll, no assist required  Transfers Overall transfer level: Modified independent Equipment used: None             General transfer comment: intermittent cueing for hand placement for safeyt     Balance Overall balance assessment: Needs assistance Sitting-balance support: Feet supported;No upper extremity supported Sitting balance-Leahy Scale: Good     Standing balance support: Bilateral upper extremity supported;No upper extremity supported;During functional  activity Standing balance-Leahy Scale: Good                             ADL either performed or assessed with clinical judgement   ADL Overall ADL's : Needs assistance/impaired             Lower Body Bathing: Modified independent;Sit to/from stand;Adhering to back precautions;With adaptive equipment Lower Body Bathing Details (indicate cue type and reason): simulated using AE  Upper Body Dressing : Modified independent;Sitting Upper Body Dressing Details (indicate cue type and reason): donning brace without assist, increased time  Lower Body Dressing: Sit to/from stand;Supervision/safety;With adaptive equipment;Adhering to back precautions Lower Body Dressing Details (indicate cue type and reason): using AE with minimal cueing for techniques to increase ease  Toilet Transfer: Modified Independent;Ambulation;RW Toilet Transfer Details (indicate cue type and reason): simulated in room   Toileting - Clothing Manipulation Details (indicate cue type and reason): reports plan to use tongs      Functional mobility during ADLs: Modified independent;Rolling walker General ADL Comments: patient progressing well, completing ADLs and transfers with supervision to modified independence; good recall of precautions and use of AE; patient reports spouse purchasing AE kit      Vision       Perception     Praxis      Cognition Arousal/Alertness: Awake/alert Behavior During Therapy: WFL for tasks assessed/performed Overall Cognitive Status: Within Functional Limits for tasks assessed                                          Exercises  Shoulder Instructions       General Comments      Pertinent Vitals/ Pain       Pain Assessment: Faces Faces Pain Scale: Hurts a little bit Pain Location: Incision sites; abdomen and back Pain Descriptors / Indicators: Operative site guarding;Sore;Grimacing;Guarding Pain Intervention(s): Limited activity within  patient's tolerance;Monitored during session;Repositioned  Home Living                                          Prior Functioning/Environment              Frequency  Min 2X/week        Progress Toward Goals  OT Goals(current goals can now be found in the care plan section)  Progress towards OT goals: Goals met/education completed, patient discharged from OT  Acute Rehab OT Goals Patient Stated Goal: home soon OT Goal Formulation: With patient  Plan All goals met and education completed, patient discharged from OT services    Co-evaluation                 AM-PAC OT "6 Clicks" Daily Activity     Outcome Measure   Help from another person eating meals?: None Help from another person taking care of personal grooming?: None Help from another person toileting, which includes using toliet, bedpan, or urinal?: None Help from another person bathing (including washing, rinsing, drying)?: None Help from another person to put on and taking off regular upper body clothing?: None Help from another person to put on and taking off regular lower body clothing?: A Little 6 Click Score: 23    End of Session Equipment Utilized During Treatment: Back brace;Rolling walker  OT Visit Diagnosis: Unsteadiness on feet (R26.81);Pain Pain - part of body: (back)   Activity Tolerance Patient tolerated treatment well   Patient Left Other (comment)(walking hall with RN aware )   Nurse Communication          Time: 4037-5436 OT Time Calculation (min): 14 min  Charges: OT General Charges $OT Visit: 1 Visit OT Treatments $Self Care/Home Management : 8-22 mins  Jolaine Artist, OT Acute Rehabilitation Services Pager [phone] Office [place] 06/17/2019, 9:33 AM

## 2019-06-17 NOTE — Plan of Care (Signed)
ADEQUATELY READY FOR DISCHARGED

## 2019-06-17 NOTE — Progress Notes (Signed)
    Subjective:  Patient reports pain as mild to moderate.  Denies N/V/CP/SOB.   Objective:   VITALS:   Vitals:   06/16/19 2357 06/17/19 0000 06/17/19 0344 06/17/19 0811  BP: 119/76  122/79 118/75  Pulse: (!) 53 74 79 91  Resp: 20  20 16   Temp: 98.4 F (36.9 C)  98.1 F (36.7 C) 97.6 F (36.4 C)  TempSrc: Oral  Oral Oral  SpO2: 95%  95% 96%  Weight:      Height:        NAD ABD soft Sensation intact distally Intact pulses distally Dorsiflexion/Plantar flexion intact Incision: scant drainage Paresthesias LFCN distribution  Lab Results  Component Value Date   WBC 7.5 06/11/2019   HGB 17.3 (H) 06/11/2019   HCT 50.5 06/11/2019   MCV 97.7 06/11/2019   PLT 178 06/11/2019   BMET    Component Value Date/Time   NA 140 06/11/2019 1151   K 4.3 06/11/2019 1151   CL 107 06/11/2019 1151   CO2 23 06/11/2019 1151   GLUCOSE 94 06/11/2019 1151   BUN 21 06/11/2019 1151   CREATININE 1.18 06/11/2019 1151   CALCIUM 9.0 06/11/2019 1151   GFRNONAA >60 06/11/2019 1151   GFRAA >60 06/11/2019 1151     Assessment/Plan: 3 Days Post-Op   Active Problems:   S/P lumbar fusion   WBAT with walker DVT ppx: ambulation, SCDs, TEDS PO pain control PT/OT Dispo: D/C home   08/09/2019 Talisha Erby 06/17/2019, 10:21 AM   08/15/2019, MD [phone] [facility] is now [facility]  [place] [address], [place], [place] Phone: [phone] [email or web address] Facebook  [phone]

## 2019-06-17 NOTE — Progress Notes (Signed)
Patient alert and oriented, mae's well, voiding adequate amount of urine, swallowing without difficulty, no c/o pain at time of discharge. Patient discharged home with family. Script and discharged instructions given to patient. Patient and family stated understanding of instructions given. Patient has an appointment with Dr. Brooks  

## 2019-06-20 MED FILL — Sodium Chloride IV Soln 0.9%: INTRAVENOUS | Qty: 2000 | Status: AC

## 2019-06-20 MED FILL — Heparin Sodium (Porcine) Inj 1000 Unit/ML: INTRAMUSCULAR | Qty: 30 | Status: AC

## 2019-06-25 NOTE — Discharge Summary (Signed)
Patient ID: Cameron Curry MRN: [id number] DOB/AGE: 07-08-1950 69 y.o.  Admit date: 06/13/2019 Discharge date: 06/25/2019  Admission Diagnoses:  Active Problems:   S/P lumbar fusion   Discharge Diagnoses:  Active Problems:   S/P lumbar fusion  status post Procedure(s): OLIF L4-5, XLIF L2-3, L3-4 POSTERIOR LUMBAR FUSION L2-L5  Past Medical History:  Diagnosis Date  . Arthritis   . Atrial fibrillation (HCC)    ablation  . Bilateral leg weakness   . DDD (degenerative disc disease), lumbar   . Degenerative scoliosis   . Dysrhythmia    atrial fibrillation  . Hypertensive disorder   . Hypothyroidism   . Irregular heartbeat   . Joint pain   . Low back pain   . Lumbar radiculopathy   . Neurogenic claudication due to lumbar spinal stenosis 01/16/2018   Of lumbar region.  . Peripheral nerve disease     Surgeries: Procedure(s): POSTERIOR LUMBAR FUSION L2-L5 on 06/14/2019   Consultants:   Discharged Condition: Improved  Hospital Course: Cameron Curry is an 69 y.o. male who was admitted 06/13/2019 for operative treatment of Degenerative lumbar disc disease, lumbar spinal stenosis with neurogenic claudication.. Patient failed conservative treatments (please see the history and physical for the specifics) and had severe unremitting pain that affects sleep, daily activities and work/hobbies. After pre-op clearance, the patient was taken to the operating room on 06/14/2019 and underwent  Procedure(s): POSTERIOR LUMBAR FUSION L2-L5.    Patient was given perioperative antibiotics:  Anti-infectives (From admission, onward)   Start     Dose/Rate Route Frequency Ordered Stop   06/14/19 1215  ceFAZolin (ANCEF) IVPB 1 g/50 mL premix     1 g 100 mL/hr over 30 Minutes Intravenous Every 8 hours 06/14/19 1204 06/15/19 0800   06/14/19 0600  ceFAZolin (ANCEF) 3 g in dextrose 5 % 50 mL IVPB     3 g 100 mL/hr over 30 Minutes Intravenous 30 min pre-op 06/13/19 1643 06/14/19 1237   06/13/19  1645  ceFAZolin (ANCEF) IVPB 1 g/50 mL premix     1 g 100 mL/hr over 30 Minutes Intravenous Every 8 hours 06/13/19 1642 06/14/19 0840   06/13/19 0000  ceFAZolin (ANCEF) 3 g in dextrose 5 % 50 mL IVPB     3 g 100 mL/hr over 30 Minutes Intravenous 30 min pre-op 06/12/19 0719 06/14/19 1238   06/12/19 0702  ceFAZolin (ANCEF) 3 g in dextrose 5 % 50 mL IVPB  Status:  Discontinued     3 g 100 mL/hr over 30 Minutes Intravenous 30 min pre-op 06/12/19 1443 06/12/19 0719       Patient was given sequential compression devices and early ambulation to prevent DVT.   Patient benefited maximally from hospital stay and there were no complications. At the time of discharge, the patient was urinating/moving their bowels without difficulty, tolerating a regular diet, pain is controlled with oral pain medications and they have been cleared by PT/OT.   Recent vital signs: No data found.   Recent laboratory studies: No results for input(s): WBC, HGB, HCT, PLT, NA, K, CL, CO2, BUN, CREATININE, GLUCOSE, INR, CALCIUM in the last 72 hours.  Invalid input(s): PT, 2   Discharge Medications:   Allergies as of 06/17/2019   No Known Allergies     Medication List    STOP taking these medications   aspirin EC 81 MG tablet     TAKE these medications   levothyroxine 50 MCG tablet Commonly known as: SYNTHROID Take 50 mcg by  mouth daily before breakfast.   lisinopril 10 MG tablet Commonly known as: ZESTRIL Take 10 mg by mouth daily.   metoprolol succinate 25 MG 24 hr tablet Commonly known as: TOPROL-XL Take 25 mg by mouth daily.   multivitamin with minerals Tabs tablet Take 1 tablet by mouth daily.   ondansetron 4 MG tablet Commonly known as: Zofran Take 1 tablet (4 mg total) by mouth every 8 (eight) hours as needed for nausea or vomiting.     ASK your doctor about these medications   methocarbamol 500 MG tablet Commonly known as: Robaxin Take 1 tablet (500 mg total) by mouth every 8 (eight) hours  as needed for up to 5 days for muscle spasms. Ask about: Should I take this medication?   oxyCODONE-acetaminophen 10-325 MG tablet Commonly known as: Percocet Take 1 tablet by mouth every 6 (six) hours as needed for up to 5 days for pain. Ask about: Should I take this medication?       Diagnostic Studies: DG Chest 2 View  Result Date: 06/11/2019 CLINICAL DATA:  Preoperative evaluation for spine surgery, atrial fibrillation, hypertension EXAM: CHEST - 2 VIEW COMPARISON:  None FINDINGS: Upper normal heart size. Mediastinal contours and pulmonary vascularity normal. Lungs clear. No infiltrate, pleural effusion or pneumothorax. Scattered endplate spur formation thoracic spine. IMPRESSION: No acute abnormalities. Electronically Signed   By: Lavonia Dana M.D.   On: 06/11/2019 17:37   DG Lumbar Spine 2-3 Views  Result Date: 06/14/2019 CLINICAL DATA:  69 year old male undergoing lumbar surgery. EXAM: LUMBAR SPINE - 2-3 VIEW; DG C-ARM 1-60 MIN COMPARISON:  Intraoperative lumbar images yesterday. FINDINGS: Three intraoperative fluoroscopic spot views of the lumbar spine today demonstrate the interbody implants seen yesterday plus new bilateral transpedicular hardware which appears to be from the L2 to the L5 level assuming normal lumbar segmentation. No adverse hardware features identified. IMPRESSION: Lumbar posterior and interbody fusion demonstrated. FLUOROSCOPY TIME:  4 minutes 1 second Electronically Signed   By: Genevie Ann M.D.   On: 06/14/2019 10:25   DG Lumbar Spine 2-3 Views  Result Date: 06/13/2019 CLINICAL DATA:  Oblique lateral lumbar interbody fusion L4-5, extreme lateral lumbar interbody fusion L2-4. EXAM: DG C-ARM 1-60 MIN; LUMBAR SPINE - 2-3 VIEW FLUOROSCOPY TIME:  Fluoroscopy Time: Radiation Exposure Index (if provided by the fluoroscopic device): Number of Acquired Spot Images: 2 COMPARISON:  None. FINDINGS: AP and lateral fluoroscopic spot views of the lumbar spine show interbody cages  presumably at L2-3 and L3-4 and an interbody strut at L4-5, based on the provided clinical history. Anatomical landmarks are not visualized for definitive numbering of the visualized spinal levels. There is a compression deformity involving the lumbar vertebral body above the superior most interbody cage. IMPRESSION: Intraoperative visualization for reported L2-4 and L4-5 interbody fusions. Please see discussion above. Electronically Signed   By: Lorin Picket M.D.   On: 06/13/2019 14:45   DG C-Arm 1-60 Min  Result Date: 06/14/2019 CLINICAL DATA:  69 year old male undergoing lumbar surgery. EXAM: LUMBAR SPINE - 2-3 VIEW; DG C-ARM 1-60 MIN COMPARISON:  Intraoperative lumbar images yesterday. FINDINGS: Three intraoperative fluoroscopic spot views of the lumbar spine today demonstrate the interbody implants seen yesterday plus new bilateral transpedicular hardware which appears to be from the L2 to the L5 level assuming normal lumbar segmentation. No adverse hardware features identified. IMPRESSION: Lumbar posterior and interbody fusion demonstrated. FLUOROSCOPY TIME:  4 minutes 1 second Electronically Signed   By: Genevie Ann M.D.   On: 06/14/2019 10:25  DG C-Arm 1-60 Min  Result Date: 06/13/2019 CLINICAL DATA:  Oblique lateral lumbar interbody fusion L4-5, extreme lateral lumbar interbody fusion L2-4. EXAM: DG C-ARM 1-60 MIN; LUMBAR SPINE - 2-3 VIEW FLUOROSCOPY TIME:  Fluoroscopy Time: Radiation Exposure Index (if provided by the fluoroscopic device): Number of Acquired Spot Images: 2 COMPARISON:  None. FINDINGS: AP and lateral fluoroscopic spot views of the lumbar spine show interbody cages presumably at L2-3 and L3-4 and an interbody strut at L4-5, based on the provided clinical history. Anatomical landmarks are not visualized for definitive numbering of the visualized spinal levels. There is a compression deformity involving the lumbar vertebral body above the superior most interbody cage. IMPRESSION:  Intraoperative visualization for reported L2-4 and L4-5 interbody fusions. Please see discussion above. Electronically Signed   By: Leanna Battles M.D.   On: 06/13/2019 14:45    Discharge Instructions    Call MD / Call 911   Complete by: As directed    If you experience chest pain or shortness of breath, CALL 911 and be transported to the hospital emergency room.  If you develope a fever above 101 F, pus (white drainage) or increased drainage or redness at the wound, or calf pain, call your surgeon's office.   Constipation Prevention   Complete by: As directed    Drink plenty of fluids.  Prune juice may be helpful.  You may use a stool softener, such as Colace (over the counter) 100 mg twice a day.  Use MiraLax (over the counter) for constipation as needed.   Diet - low sodium heart healthy   Complete by: As directed    Discharge instructions   Complete by: As directed    See Dr. Shon Baton' discharge instructions   Incentive spirometry RT   Complete by: As directed    Incentive spirometry RT   Complete by: As directed    Increase activity slowly as tolerated   Complete by: As directed       Follow-up Information    Venita Lick, MD. Schedule an appointment as soon as possible for a visit in 2 weeks.   Specialty: Orthopedic Surgery Why: If symptoms worsen, For suture removal, For wound re-check Contact information: [address] [place] [address] [phone]           Discharge Plan:  discharge to home with HHPT  Disposition: stable    Signed: Leonette Monarch Tyric Rodeheaver for [facility] PA-C [facility] [phone] 06/25/2019, 8:45 AM

## 2019-06-29 ENCOUNTER — Ambulatory Visit (HOSPITAL_COMMUNITY)
Admission: RE | Admit: 2019-06-29 | Discharge: 2019-06-29 | Disposition: A | Discharge status: Home / Self Care | Payer: Medicare Other | Source: Ambulatory Visit | Attending: Orthopedic Surgery | Admitting: Orthopedic Surgery

## 2019-06-29 ENCOUNTER — Other Ambulatory Visit: Payer: Self-pay

## 2019-06-29 ENCOUNTER — Other Ambulatory Visit (HOSPITAL_COMMUNITY): Payer: Self-pay | Admitting: Orthopedic Surgery

## 2019-06-29 DIAGNOSIS — R609 Edema, unspecified: Secondary | ICD-10-CM | POA: Diagnosis not present

## 2019-06-29 NOTE — Progress Notes (Signed)
Lower extremity venous has been completed.   Preliminary results in CV Proc.   Blanch Media 06/29/2019 4:15 PM

## 2021-07-16 IMAGING — CR DG CHEST 2V
2 series · 2 of 2 positions shown · non-contrast
Comparison: None

CLINICAL DATA: Preoperative evaluation for spine surgery, atrial
fibrillation, hypertension

EXAM:
CHEST - 2 VIEW

[w chest pa]
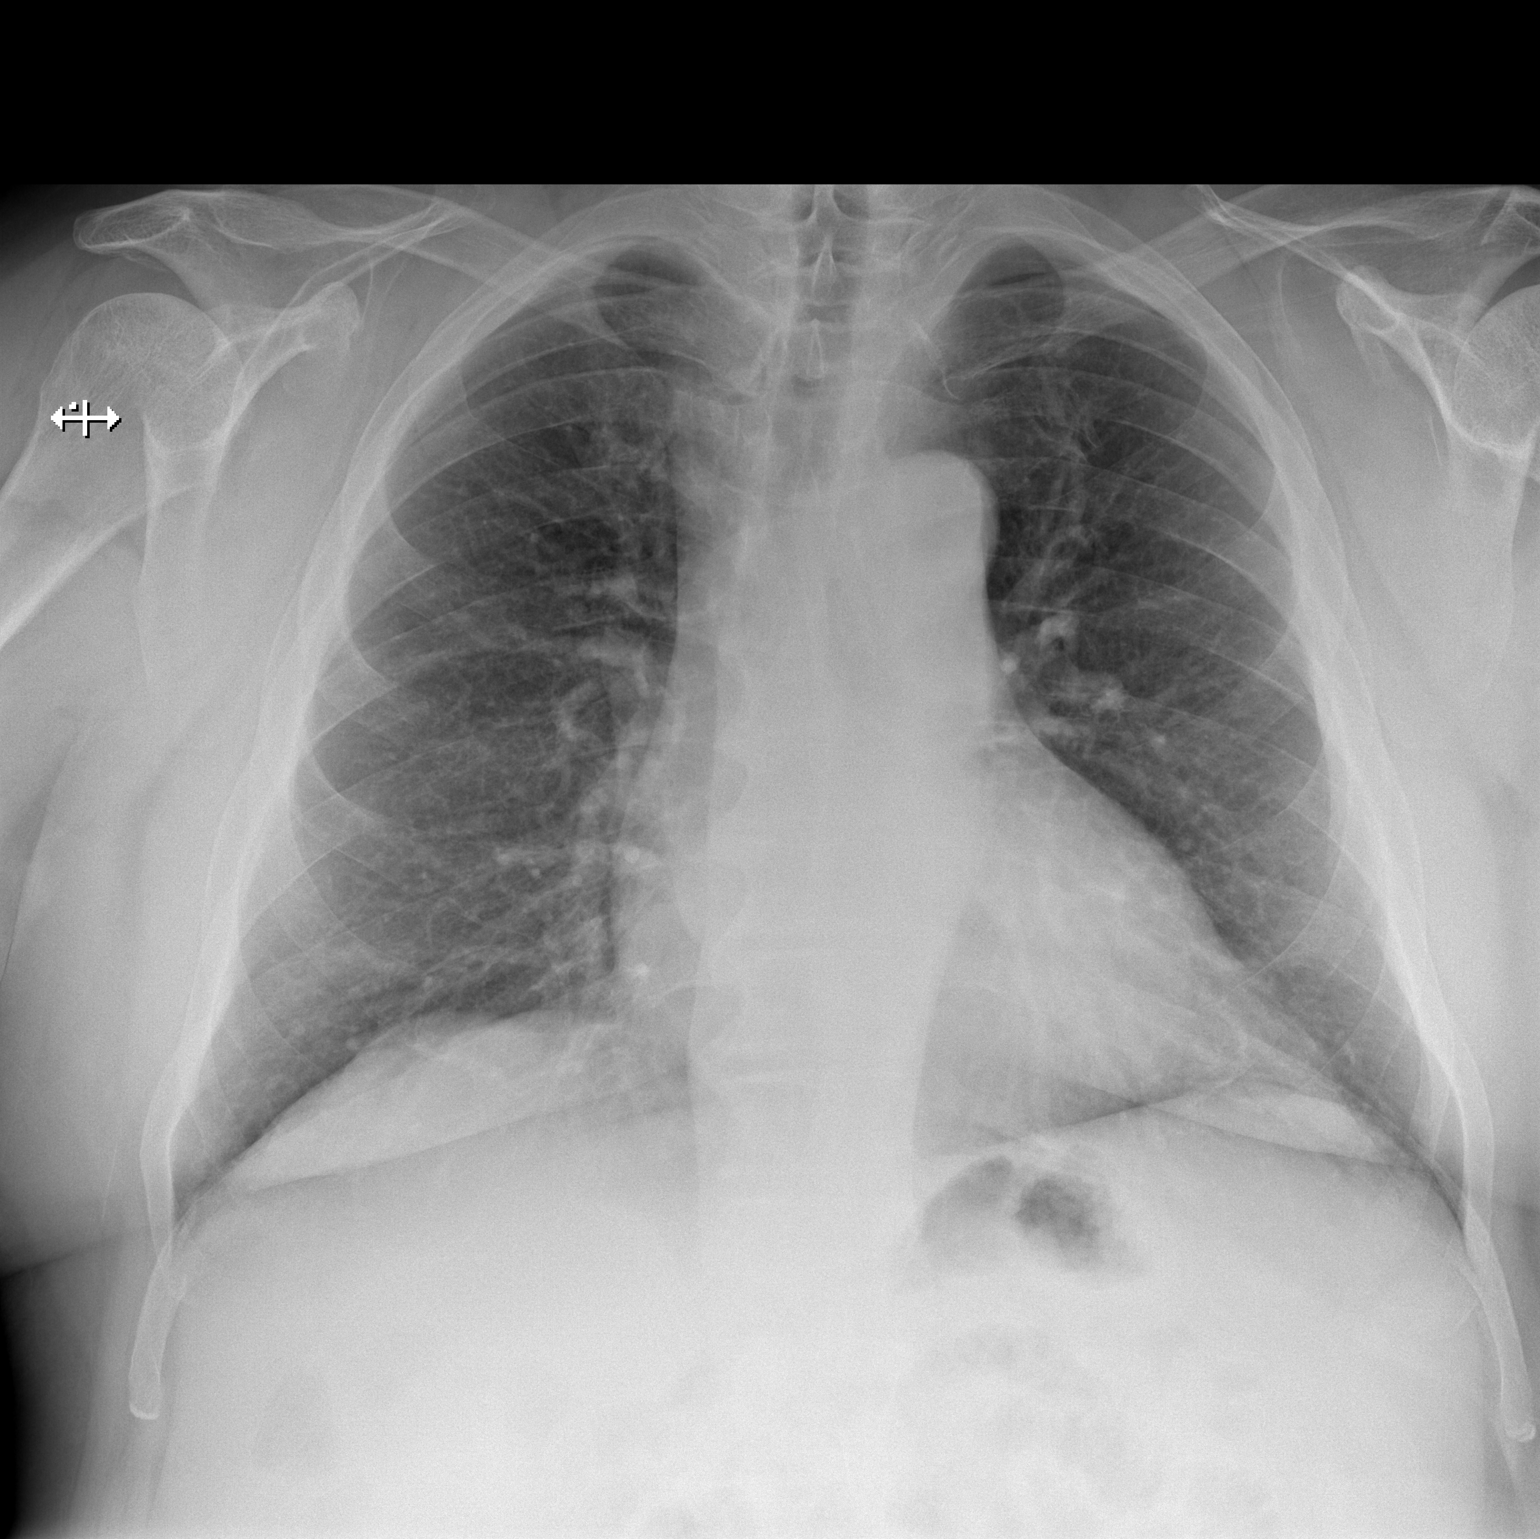

[w chest lat]
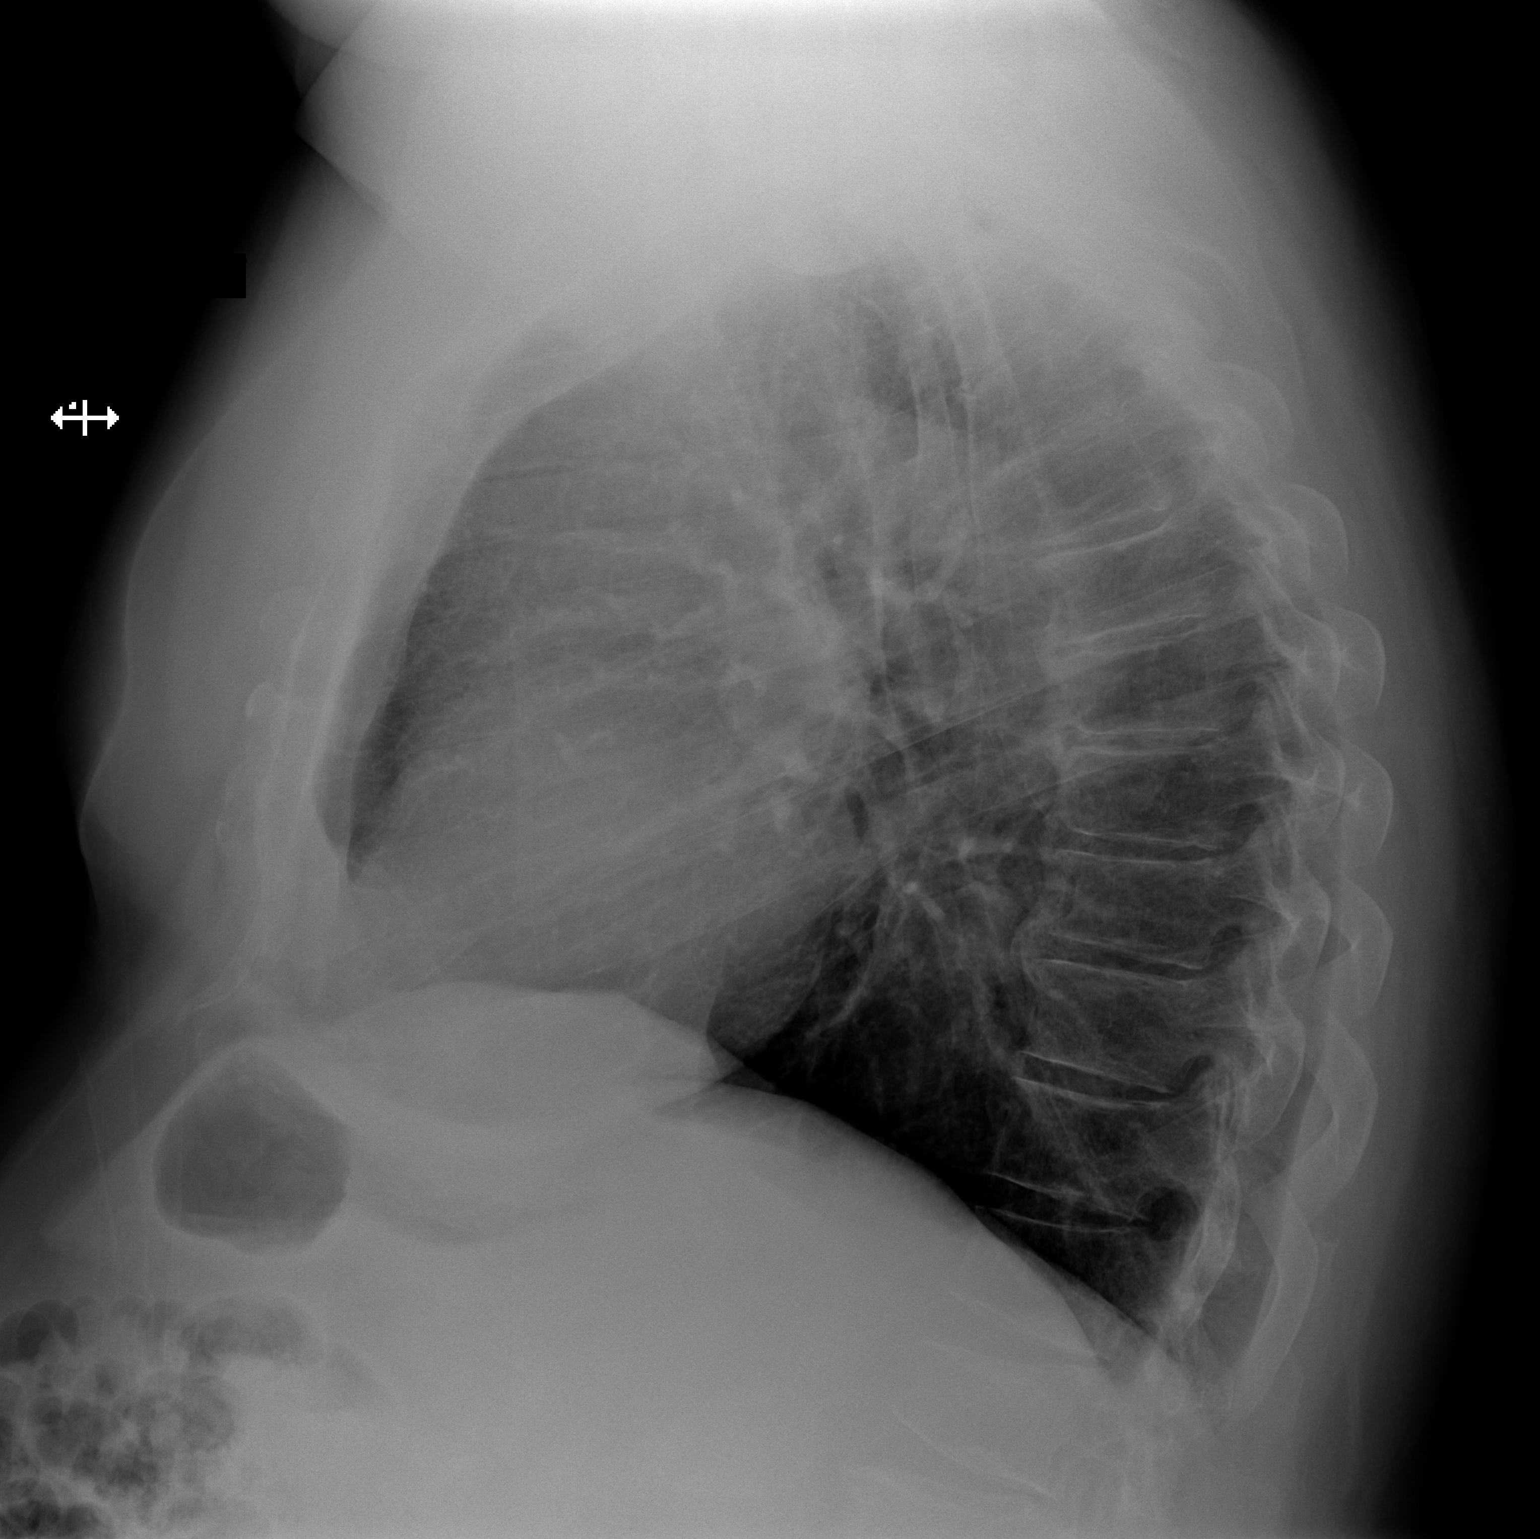

[2 of 2 positions shown; findings below may reference images not displayed]

FINDINGS: Upper normal heart size.

Mediastinal contours and pulmonary vascularity normal.

Lungs clear.

No infiltrate, pleural effusion or pneumothorax.

Scattered endplate spur formation thoracic spine.
IMPRESSION: No acute abnormalities.

## 2021-07-18 IMAGING — RF DG C-ARM 1-60 MIN
1 series · 2 of 2 positions shown · non-contrast
Comparison: None.

CLINICAL DATA: Oblique lateral lumbar interbody fusion L4-5,
extreme lateral lumbar interbody fusion L2-4.

EXAM:
DG C-ARM 1-60 MIN; LUMBAR SPINE - 2-3 VIEW
FLUOROSCOPY TIME:  Fluoroscopy Time:
Radiation Exposure Index (if provided by the fluoroscopic device):
Number of Acquired Spot Images: 2

[Series 1: run · 2 of 2 slices shown]
[im 1/2]
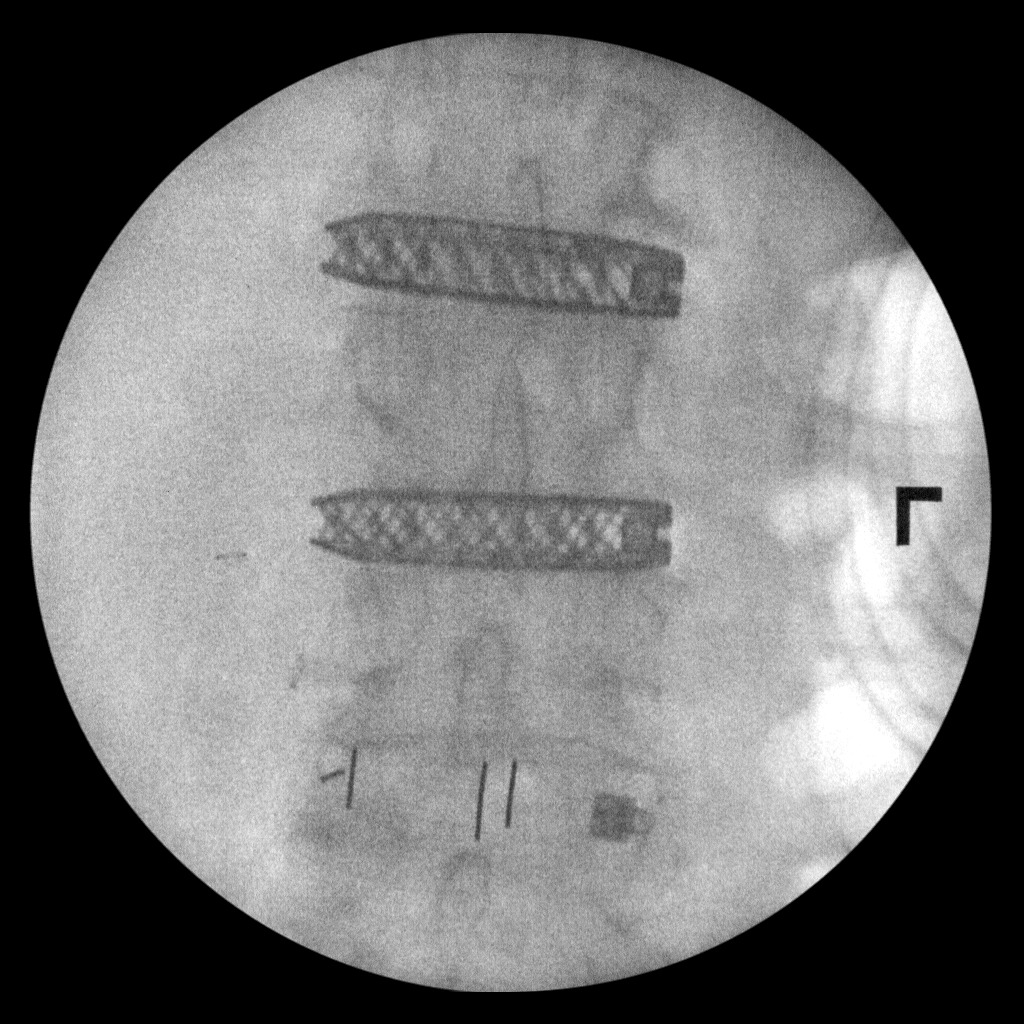
[im 2/2]
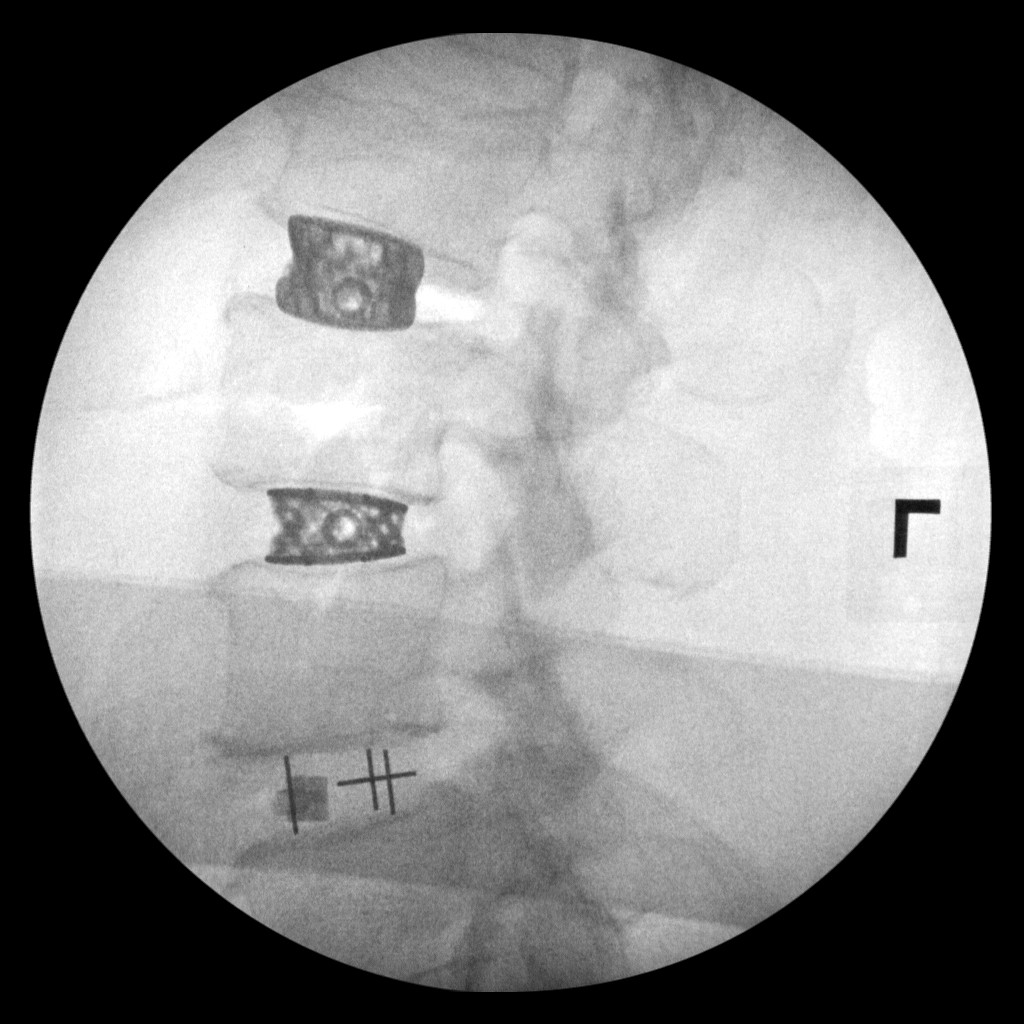

[2 of 2 positions shown; findings below may reference images not displayed]

FINDINGS: AP and lateral fluoroscopic spot views of the lumbar spine show
interbody cages presumably at L2-3 and L3-4 and an interbody strut
at L4-5, based on the provided clinical history. Anatomical
landmarks are not visualized for definitive numbering of the
visualized spinal levels. There is a compression deformity involving
the lumbar vertebral body above the superior most interbody cage.
IMPRESSION: Intraoperative visualization for reported L2-4 and L4-5 interbody
fusions. Please see discussion above.

## 2021-07-18 IMAGING — RF DG LUMBAR SPINE 2-3V
1 series · 2 of 2 positions shown · non-contrast
Comparison: None.

CLINICAL DATA: Oblique lateral lumbar interbody fusion L4-5,
extreme lateral lumbar interbody fusion L2-4.

EXAM:
DG C-ARM 1-60 MIN; LUMBAR SPINE - 2-3 VIEW
FLUOROSCOPY TIME:  Fluoroscopy Time:
Radiation Exposure Index (if provided by the fluoroscopic device):
Number of Acquired Spot Images: 2

[Series 1: run · 2 of 2 slices shown]
[im 1/2]
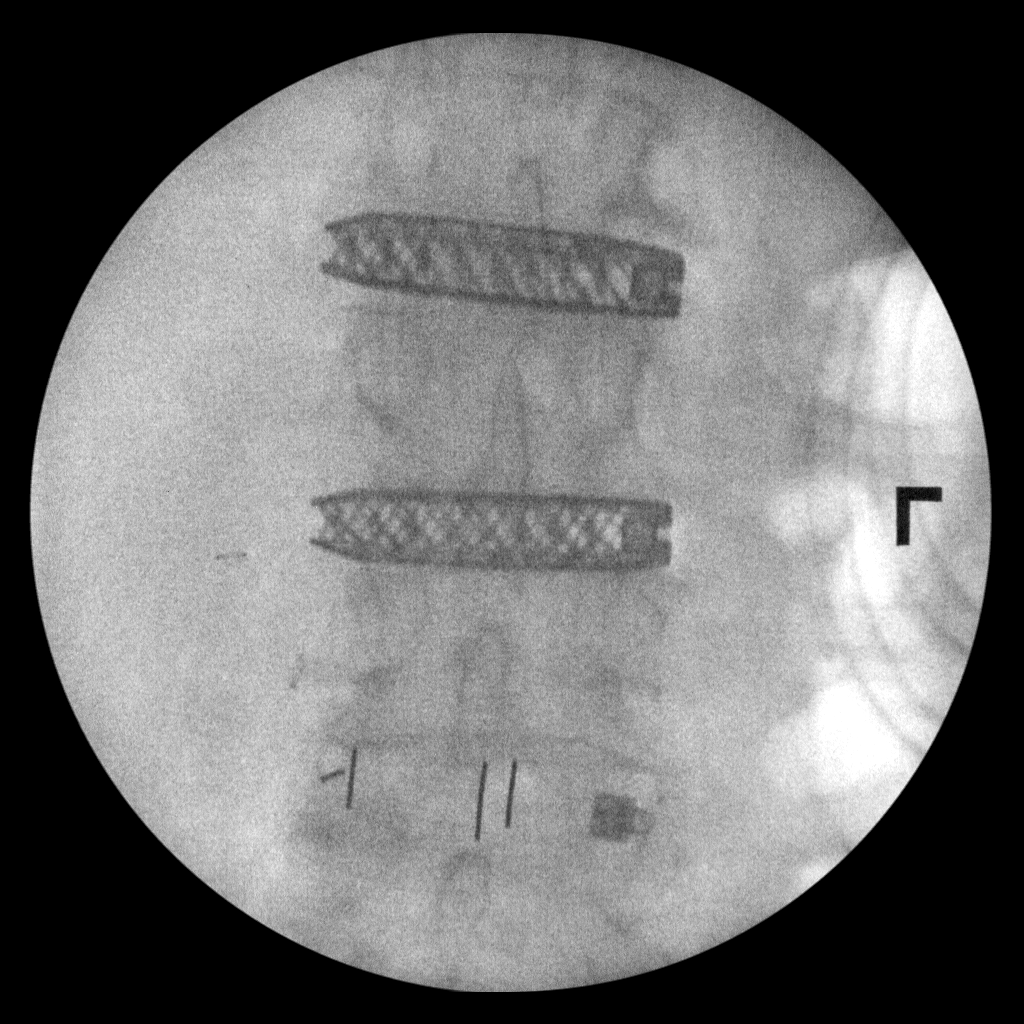
[im 2/2]
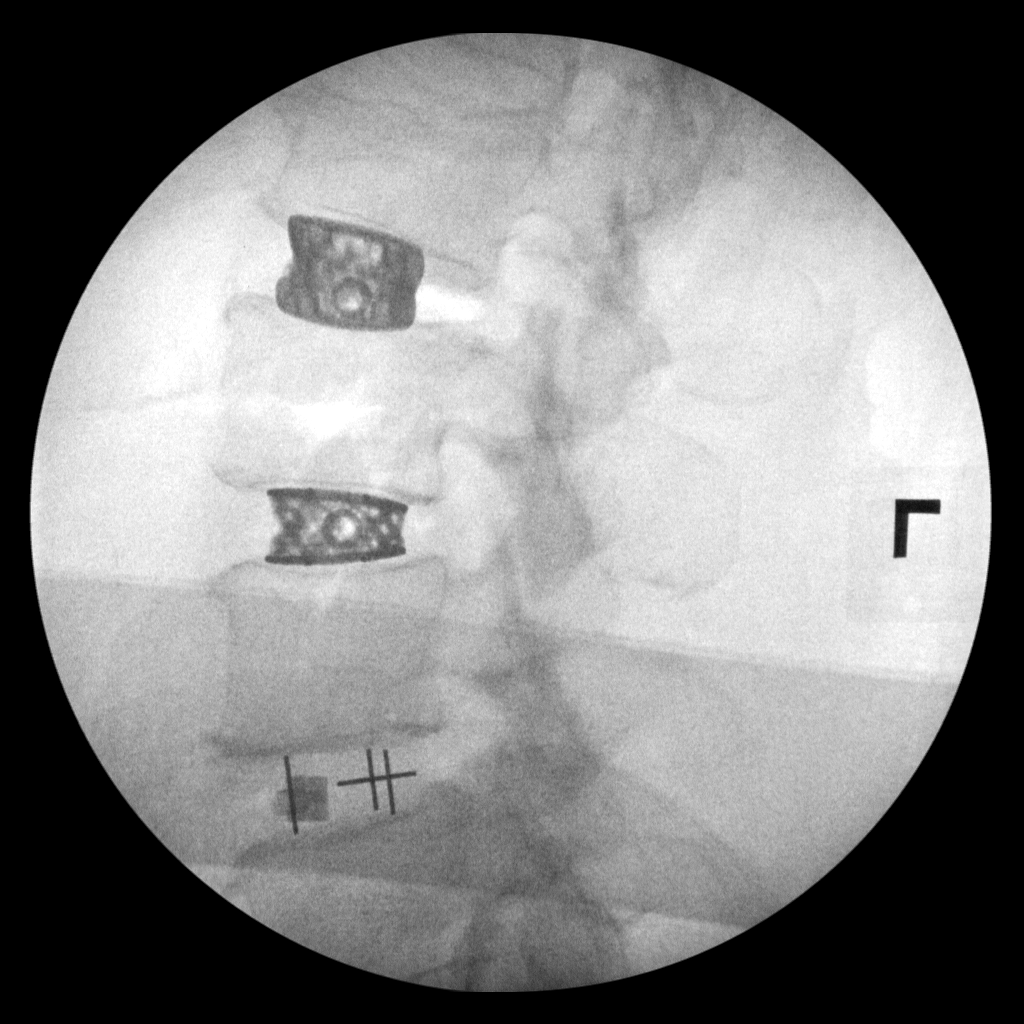

[2 of 2 positions shown; findings below may reference images not displayed]

FINDINGS: AP and lateral fluoroscopic spot views of the lumbar spine show
interbody cages presumably at L2-3 and L3-4 and an interbody strut
at L4-5, based on the provided clinical history. Anatomical
landmarks are not visualized for definitive numbering of the
visualized spinal levels. There is a compression deformity involving
the lumbar vertebral body above the superior most interbody cage.
IMPRESSION: Intraoperative visualization for reported L2-4 and L4-5 interbody
fusions. Please see discussion above.

## 2021-11-19 ENCOUNTER — Other Ambulatory Visit: Payer: Self-pay | Admitting: Orthopedic Surgery

## 2021-11-19 DIAGNOSIS — M259 Joint disorder, unspecified: Secondary | ICD-10-CM

## 2021-12-17 ENCOUNTER — Other Ambulatory Visit: Payer: Medicare Other

## 2024-05-08 NOTE — Progress Notes (Signed)
 COVID Vaccine received:  [x]  No []  Yes Date of any COVID positive Test in last 90 days: None  PCP - Norleen Mixer, MD  [phone]  clearance scanned to [facility].  Cardiologist / EP - Dr. Mabel Dunk at [facility] [phone] (Work) [phone] (Fax)  Mona Massing, NP  cardiac clearance scanned in [facility] 04-10-24 and in 04-04-24 note in EP[facility]  Chest x-ray - 06-11-2019  2v  Epic EKG - 04-04-24 at [facility]- will request tracing  Stress Test -  ECHO -  Cardiac Cath -  CT Coronary Calcium  score:   Pacemaker / ICD device []  No []  Yes   Spinal Cord Stimulator:[]  No []  Yes    Has implanted Loop Recorder - left upper anterior chest.      History of Sleep Apnea? [x]  No []  Yes  had study CPAP used?- [x]  No []  Yes    Medication on DOS: Levothyroxine , Metoprolol ,   Hold DOS:  Lisinopril   Patient has: [x]  NO Hx DM   []  Pre-DM   []  DM1  []   DM2 Does the patient monitor blood sugar?   [x]  N/A   []  No []  Yes  Last A1c was: 5.0 on 12-05-23  at PCP      Blood Thinner / Instructions: Aspirin  Instructions: ASA 81 mg- ok to hold x 7 days per Cardiology  Activity level: Able to walk up 2 flights of stairs without becoming significantly short of breath or having chest pain?   [x]    Yes   []  No,  would have:  Patient can perform ADLs without assistance.  [x]   Yes    []  No   Anesthesia review: A.fib (last ablation 09-28-2022 at [facility]), HTN,   Patient denies any S&S of respiratory illness or Covid - no shortness of breath, fever, cough or chest pain at PAT appointment.  Patient verbalized understanding and agreement to the Pre-Surgical Instructions that were given to them at this PAT appointment. Patient was also educated of the need to review these PAT instructions again prior to his surgery.I reviewed the appropriate phone numbers to call if they have any and questions or concerns.

## 2024-05-08 NOTE — Patient Instructions (Addendum)
 SURGICAL WAITING ROOM VISITATION Patients having surgery or a procedure may have no more than 2 support people in the waiting area - these visitors may rotate in the visitor waiting room.   If the patient needs to stay at the hospital during part of their recovery, the visitor guidelines for inpatient rooms apply.  PRE-OP VISITATION  Pre-op nurse will coordinate an appropriate time for 1 support person to accompany the patient in pre-op.  This support person may not rotate.  This visitor will be contacted when the time is appropriate for the visitor to come back in the pre-op area.  Please refer to the [facility] website for the visitor guidelines for Inpatients (after your surgery is over and you are in a regular room).  Temporary Visitor Restrictions  Children ages 64 and under will not be able to visit patients in [facility] under most circumstances. Visitation is not restricted outside of hospitals unless noted otherwise in the [facility] and Location Specific Visitation Guidelines at :      [email or web address] Visitors with respiratory illnesses are discouraged from visiting and should remain at home.  You are not required to quarantine at this time prior to your surgery. However, you must do this: Hand Hygiene often Do NOT share personal items Notify your provider if you are in close contact with someone who has COVID or you develop fever 100.4 or greater, new onset of sneezing, cough, sore throat, shortness of breath or body aches.  If you test positive for Covid or have been in contact with anyone that has tested positive in the last 10 days please notify you surgeon.    Your procedure is scheduled on:  Monday  05-22-2023   Report to [facility] Main Entrance: [address] entrance where the [employer] is available.   Report to admitting at:   05:45     AM  Call this number if you have any questions or problems the morning of surgery [phone]  Do not eat  food after Midnight the night prior to your surgery/procedure.  After Midnight you may have the following liquids until  05:15 AM DAY OF SURGERY  Clear Liquid Diet Water Black Coffee (sugar ok, NO MILK/CREAM OR CREAMERS)  Tea (sugar ok, NO MILK/CREAM OR CREAMERS) regular and decaf                             Plain Jell-O  with no fruit (NO RED)                                           Fruit ices (not with fruit pulp, NO RED)                                     Popsicles (NO RED)                                                                  Juice: NO CITRUS JUICES: only apple, WHITE grape, WHITE cranberry Sports drinks like Gatorade or Powerade (  NO RED)               FOLLOW ANY ADDITIONAL PRE OP INSTRUCTIONS YOU RECEIVED FROM YOUR SURGEON'S OFFICE!!!   Oral Hygiene is also important to reduce your risk of infection.        Remember - BRUSH YOUR TEETH THE MORNING OF SURGERY WITH YOUR REGULAR TOOTHPASTE  Do NOT smoke after Midnight the night before surgery.  STOP TAKING all Vitamins, Herbs and supplements 1 week before your surgery.   ASPIRIN - Stop taking 1 week before your surgery.   Take ONLY these medicines the morning of surgery with A SIP OF WATER: Levothyroxine , metoprolol   DO NOT TAKE LISINOPRIL  the morning of your surgery.                   You may not have any metal on your body including  jewelry, and body piercing  Do not wear lotions, powders, cologne, or deodorant  Men may shave face and neck.  Contacts, Hearing Aids, dentures or bridgework may not be worn into surgery. DENTURES WILL BE REMOVED PRIOR TO SURGERY PLEASE DO NOT APPLY Poly grip OR ADHESIVES!!!  You may bring a small overnight bag with you on the day of surgery, only pack items that are not valuable. [place] IS NOT RESPONSIBLE   FOR VALUABLES THAT ARE LOST OR STOLEN.   Do not bring your home medications to the hospital. The Pharmacy will dispense medications listed on your medication list to you  during your admission in the Hospital.  Please read over the following fact sheets you were given: IF YOU HAVE QUESTIONS ABOUT YOUR PRE-OP INSTRUCTIONS, PLEASE CALL [phone].      Pre-operative 4 CHG Bath Instructions   You can play a key role in reducing the risk of infection after surgery. Your skin needs to be as free of germs as possible. You can reduce the number of germs on your skin by washing with CHG (chlorhexidine  gluconate) soap before surgery. CHG is an antiseptic soap that kills germs and continues to kill germs even after washing.   DO NOT use if you have an allergy to chlorhexidine /CHG or antibacterial soaps. If your skin becomes reddened or irritated, stop using the CHG and notify one of our RNs at [phone]  Please shower with the CHG soap starting 4 days before surgery using the following schedule: THURSDAY  05-17-2024     Do NOT use CHG soap                                                                                                                      the morning of your  surgery.         Please keep in mind the following:  DO NOT shave, including legs and underarms, starting the day of your first shower.   You may shave your face at any point before/day of surgery.  Place clean sheets on your bed the day you start using CHG soap. Use a clean washcloth (not used since being washed) for each shower. DO NOT sleep with pets once you start using the CHG.  CHG Shower Instructions:  If you choose to wash your hair and private area, wash first with your normal shampoo/soap.  After you use shampoo/soap, rinse your hair and body thoroughly to remove shampoo/soap residue.  Turn the water OFF and apply about 3 tablespoons (45 ml) of CHG soap to a CLEAN washcloth.  Apply CHG soap ONLY FROM YOUR NECK DOWN TO YOUR TOES (washing for 3-5 minutes)   DO NOT use CHG soap on face, private areas, open wounds, or sores.  Pay special attention to the area where your surgery is being performed.  If you are having back surgery, having someone wash your back for you may be helpful. Wait 2 minutes after CHG soap is applied, then you may rinse off the CHG soap.  Pat dry with a clean towel  Put on clean clothes/pajamas   If you choose to wear lotion, please use ONLY the CHG-compatible lotions on the back of this paper.     Additional instructions for the day of surgery: DO NOT APPLY any CHG Soap,  lotions, deodorants, cologne, or perfumes on the day of surgery  Put on clean/comfortable clothes.  Brush your teeth.  Ask your nurse before applying any prescription medications to the skin.   CHG Compatible Lotions   Aveeno Moisturizing lotion  Cetaphil Moisturizing Cream  Cetaphil Moisturizing Lotion  Clairol Herbal Essence Moisturizing Lotion, Dry Skin  Clairol Herbal Essence Moisturizing Lotion, Extra Dry Skin  Clairol Herbal Essence Moisturizing Lotion, Normal Skin  Curel Age Defying Therapeutic Moisturizing Lotion with Alpha Hydroxy  Curel Extreme Care Body Lotion  Curel Soothing Hands Moisturizing Hand Lotion  Curel Therapeutic Moisturizing Cream, Fragrance-Free  Curel Therapeutic Moisturizing Lotion, Fragrance-Free  Curel Therapeutic Moisturizing Lotion, Original Formula  Eucerin Daily Replenishing Lotion  Eucerin Dry Skin Therapy Plus Alpha Hydroxy Crme  Eucerin Dry Skin Therapy Plus Alpha Hydroxy Lotion  Eucerin Original Crme  Eucerin Original Lotion  Eucerin Plus Crme Eucerin Plus Lotion  Eucerin TriLipid Replenishing Lotion  Keri Anti-Bacterial Hand Lotion  Keri Deep Conditioning Original Lotion Dry Skin Formula Softly Scented  Keri Deep Conditioning Original Lotion, Fragrance Free Sensitive Skin Formula  Keri Lotion Fast Absorbing Fragrance Free Sensitive Skin Formula  Keri Lotion Fast Absorbing Softly Scented Dry Skin  Formula  Keri Original Lotion  Keri Skin Renewal Lotion Keri Silky Smooth Lotion  Keri Silky Smooth Sensitive Skin Lotion  Nivea Body Creamy Conditioning Oil  Nivea Body Extra Enriched Lotion  Nivea Body Original Lotion  Nivea Body Sheer Moisturizing Lotion Nivea Crme  Nivea Skin Firming Lotion  NutraDerm 30 Skin Lotion  NutraDerm Skin Lotion  NutraDerm Therapeutic Skin Cream  NutraDerm Therapeutic Skin Lotion  ProShield Protective Hand Cream  Provon moisturizing lotion   FAILURE TO FOLLOW THESE INSTRUCTIONS MAY RESULT IN THE CANCELLATION OF YOUR SURGERY  PATIENT SIGNATURE_________________________________  NURSE SIGNATURE__________________________________  ________________________________________________________________________         Cameron Curry    An incentive spirometer is a tool that can help keep your lungs clear and active. This tool  measures how well you are filling your lungs with each breath. Taking long deep breaths may help reverse or decrease the chance of developing breathing (pulmonary) problems (especially infection) following: A long period of time when you are unable to move or be active. BEFORE THE PROCEDURE  If the spirometer includes an indicator to show your best effort, your nurse or respiratory therapist will set it to a desired goal. If possible, sit up straight or lean slightly forward. Try not to slouch. Hold the incentive spirometer in an upright position. INSTRUCTIONS FOR USE  Sit on the edge of your bed if possible, or sit up as far as you can in bed or on a chair. Hold the incentive spirometer in an upright position. Breathe out normally. Place the mouthpiece in your mouth and seal your lips tightly around it. Breathe in slowly and as deeply as possible, raising the piston or the ball toward the top of the column. Hold your breath for 3-5 seconds or for as long as possible. Allow the piston or ball to fall to the bottom of the  column. Remove the mouthpiece from your mouth and breathe out normally. Rest for a few seconds and repeat Steps 1 through 7 at least 10 times every 1-2 hours when you are awake. Take your time and take a few normal breaths between deep breaths. The spirometer may include an indicator to show your best effort. Use the indicator as a goal to work toward during each repetition. After each set of 10 deep breaths, practice coughing to be sure your lungs are clear. If you have an incision (the cut made at the time of surgery), support your incision when coughing by placing a pillow or rolled up towels firmly against it. Once you are able to get out of bed, walk around indoors and cough well. You may stop using the incentive spirometer when instructed by your caregiver.  RISKS AND COMPLICATIONS Take your time so you do not get dizzy or light-headed. If you are in pain, you may need to take or ask for pain medication before doing incentive spirometry. It is harder to take a deep breath if you are having pain. AFTER USE Rest and breathe slowly and easily. It can be helpful to keep track of a log of your progress. Your caregiver can provide you with a simple table to help with this. If you are using the spirometer at home, follow these instructions: SEEK MEDICAL CARE IF:  You are having difficultly using the spirometer. You have trouble using the spirometer as often as instructed. Your pain medication is not giving enough relief while using the spirometer. You develop fever of 100.5 F (38.1 C) or higher.                                                                                                    SEEK IMMEDIATE MEDICAL CARE IF:  You cough up bloody sputum that had not been present before. You develop fever of 102 F (38.9 C) or greater. You develop worsening pain at or near the incision site.  MAKE SURE YOU:  Understand these instructions. Will watch your condition. Will get help right away if  you are not doing well or get worse. Document Released: 09/06/2006 Document Revised: 07/19/2011 Document Reviewed: 11/07/2006 [facility] Patient Information 2014 [place], [place].       If you would like to see a video about joint replacement:   [email or web address]

## 2024-05-09 ENCOUNTER — Encounter (HOSPITAL_COMMUNITY)
Admission: RE | Admit: 2024-05-09 | Discharge: 2024-05-09 | Disposition: A | Discharge status: Home / Self Care | Source: Ambulatory Visit | Attending: Orthopedic Surgery | Admitting: Orthopedic Surgery

## 2024-05-09 ENCOUNTER — Encounter (HOSPITAL_COMMUNITY): Payer: Self-pay

## 2024-05-09 ENCOUNTER — Other Ambulatory Visit: Payer: Self-pay

## 2024-05-09 VITALS — BP 139/80 | HR 68 | Temp 97.7°F | Resp 20 | Ht 71.0 in | Wt 250.0 lb

## 2024-05-09 DIAGNOSIS — Z01812 Encounter for preprocedural laboratory examination: Secondary | ICD-10-CM | POA: Diagnosis present

## 2024-05-09 DIAGNOSIS — I2510 Atherosclerotic heart disease of native coronary artery without angina pectoris: Secondary | ICD-10-CM | POA: Insufficient documentation

## 2024-05-09 DIAGNOSIS — M1711 Unilateral primary osteoarthritis, right knee: Secondary | ICD-10-CM | POA: Insufficient documentation

## 2024-05-09 DIAGNOSIS — I10 Essential (primary) hypertension: Secondary | ICD-10-CM | POA: Diagnosis not present

## 2024-05-09 DIAGNOSIS — Z01818 Encounter for other preprocedural examination: Secondary | ICD-10-CM

## 2024-05-09 LAB — BASIC METABOLIC PANEL WITH GFR
Anion gap: 13 (ref 5–15)
BUN: 22 mg/dL (ref 8–23)
CO2: 21 mmol/L — ABNORMAL LOW (ref 22–32)
Calcium: 9.4 mg/dL (ref 8.9–10.3)
Chloride: 105 mmol/L (ref 98–111)
Creatinine, Ser: 1.08 mg/dL (ref 0.61–1.24)
GFR, Estimated: >60 mL/min
Glucose, Bld: 95 mg/dL (ref 70–99)
Potassium: 4.8 mmol/L (ref 3.5–5.1)
Sodium: 138 mmol/L (ref 135–145)

## 2024-05-09 LAB — CBC
HCT: 50.2 % (ref 39.0–52.0)
Hemoglobin: 16.7 g/dL (ref 13.0–17.0)
MCH: 32.8 pg (ref 26.0–34.0)
MCHC: 33.3 g/dL (ref 30.0–36.0)
MCV: 98.6 fL (ref 80.0–100.0)
Platelets: 161 K/uL (ref 150–400)
RBC: 5.09 MIL/uL (ref 4.22–5.81)
RDW: 12.4 % (ref 11.5–15.5)
WBC: 7.7 K/uL (ref 4.0–10.5)
nRBC: 0 % (ref 0.0–0.2)

## 2024-05-09 LAB — SURGICAL PCR SCREEN
MRSA, PCR: NEGATIVE
Staphylococcus aureus: NEGATIVE

## 2024-05-09 NOTE — H&P (Signed)
 " TOTAL KNEE ADMISSION H&P  Patient is being admitted for right total knee arthroplasty.  Subjective:  Chief Complaint: Right knee pain.  HPI: Cameron Curry, 73 y.o. male has a history of pain and functional disability in the right knee due to arthritis and has failed non-surgical conservative treatments for greater than 12 weeks to include NSAID's and/or analgesics, corticosteriod injections, viscosupplementation injections, flexibility and strengthening excercises, use of assistive devices, and weight reduction as appropriate. Onset of symptoms was gradual, starting several years ago with gradually worsening course since that time. The patient noted no past surgery on the right knee.  Patient currently rates pain in the right knee at 8 out of 10 with activity. Patient has night pain, worsening of pain with activity and weight bearing, pain that interferes with activities of daily living, pain with passive range of motion, and crepitus. Patient has evidence of MRI of the left knee demonstrates a medial meniscal tear and bone-on-bone changes in the patellofemoral compartment.Plain films of the right knee demonstrate bone-on-bone changes in the medial and patellofemoral compartments. by imaging studies. There is no active infection.  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   Peripheral nerve disease     Past Surgical History:  Procedure Laterality Date   ABDOMINAL EXPOSURE N/A 06/13/2019   Procedure: ABDOMINAL  EXPOSURE;  Surgeon: Oris Krystal FALCON, MD;  Location: [facility] OR;  Service: Vascular;  Laterality: N/A;   ABLATION  2019   Had 3 ablations total at [facility]   ANKLE SURGERY  2014   ORIF   ANTERIOR LAT LUMBAR FUSION N/A 06/13/2019   Procedure: Oblique lumbar interbody fusion L4-5, anterior lateral lumbar fusion L2-4;  Surgeon: Burnetta Aures, MD;  Location: [facility] OR;  Service: Orthopedics;  Laterality: N/A;   ANTERIOR LUMBAR FUSION N/A 06/13/2019   Procedure: Oblique lateral lumbar interbody fusion L4-5, extreme lateral lumbar interbody fusion L2-4;  Surgeon: Burnetta Aures, MD;  Location: [facility] OR;  Service: Orthopedics;  Laterality: N/A;  5 hrs Dr. Oris do approach Left sided tap block with exparel    CARDIOVERSION  05/02/2018   had total of 3 cardioversions at [facility]   FRACTURE SURGERY  2013   ORIF right ankle   HERNIA REPAIR  2021   umbilical hernia done at [facility]   TONSILLECTOMY      Prior to Admission medications  Medication Sig Start Date End Date Taking? Authorizing Provider  aspirin  EC 81 MG tablet Take 81 mg by mouth daily. Swallow whole.   Yes [provider]  levothyroxine  (SYNTHROID ) 25 MCG tablet Take 25 mcg by mouth daily before breakfast.   Yes [provider]  lisinopril  (ZESTRIL ) 5 MG tablet Take 5 mg by mouth daily.   Yes [provider]  metoprolol  succinate (TOPROL -XL) 25 MG 24 hr tablet Take 25 mg by mouth daily.    Yes [provider]  Multiple Vitamin (MULTIVITAMIN WITH MINERALS) TABS  tablet Take 1 tablet by mouth daily.   Yes [provider]    Allergies[1]  Social History   Socioeconomic History   Marital status: Married    Spouse name: Not on file   Number of children: 0   Years of education: Not on file   Highest education level: Some college, no degree  Occupational History    Comment: retired  Tobacco Use   Smoking status: Never   Smokeless tobacco: Never  Vaping Use   Vaping status: Never Used  Substance and Sexual  Activity   Alcohol use: Yes    Alcohol/week: 12.0 - 15.0 standard drinks of alcohol    Types: 12 - 15 Cans of beer per week    Comment: moderate   Drug use: Never   Sexual activity: Not Currently  Other Topics Concern   Not on file  Social History Narrative   Lives by himself, wife died 3 years ago   Caffeine- coffee, 3 cups daily   Social Drivers of Health   Tobacco Use: Low Risk (05/09/2024)   Patient History    Smoking Tobacco Use: Never    Smokeless Tobacco Use: Never    Passive Exposure: Not on file  Recent Concern: Tobacco Use - Medium Risk (04/04/2024)   Received from [facility]   Patient History    Smoking Tobacco Use: Never    Smokeless Tobacco Use: Former    Passive Exposure: Not on Actuary Strain: Low Risk (08/12/2022)   Received from [facility]   Overall Financial Resource Strain (CARDIA)    Difficulty of Paying Living Expenses: Not hard at all  Food Insecurity: No Food Insecurity (08/12/2022)   Received from [facility]   Epic    Within the past 12 months, you worried that your food would run out before you got the money to buy more.: Never true    Within the past 12 months, the food you bought just didn't last and you didn't have money to get more.: Never true  Transportation Needs: No Transportation Needs (08/12/2022)   Received from [facility] - Transportation    Lack of Transportation (Medical): No    Lack of Transportation (Non-Medical): No  Physical Activity: Not on file  Stress: Not on file  Social Connections: Not on file  Intimate Partner Violence: Not At Risk (09/28/2022)   Received from [facility]   HITS    Over the last 12 months how often did your partner scream or curse at you?: Never    Over the last 12 months how often did your partner physically hurt you?: Never    Over the last 12 months how often did your partner insult you or talk down to you?: Never    Over the last 12 months how often did your  partner threaten you with physical harm?: Never  Depression (PHQ2-9): Not on file  Alcohol Screen: Not on file  Housing: Not on file  Utilities: Not At Risk (08/12/2022)   Received from [facility] Utilities    Threatened with loss of utilities: No  Health Literacy: Not on file    Tobacco Use: Low Risk (05/09/2024)   Patient History    Smoking Tobacco Use: Never    Smokeless Tobacco Use: Never    Passive Exposure: Not on file  Recent Concern: Tobacco Use - Medium Risk (04/04/2024)   Received from [facility]   Patient History    Smoking Tobacco  Use: Never    Smokeless Tobacco Use: Former    Passive Exposure: Not on file   Social History   Substance and Sexual Activity  Alcohol Use Yes   Alcohol/week: 12.0 - 15.0 standard drinks of alcohol   Types: 12 - 15 Cans of beer per week   Comment: moderate    Family History  Problem Relation Age of Onset   Cancer Mother        ovarian   Heart disease Mother    Heart disease Father    Atrial fibrillation Brother     Review of Systems  Constitutional:  Negative for chills and fever.  Respiratory:  Negative for cough.   Cardiovascular:  Negative for chest pain.  Gastrointestinal:  Negative for abdominal pain.  Genitourinary:  Negative for dysuria.  Musculoskeletal:  Positive for joint pain.    Objective:  Physical Exam:  Well-developed male alert and oriented in no apparent distress - Left knee: No swelling. Range of motion 0-120 degrees. Slight tenderness medially. No lateral tenderness or instability. - Right knee: No effusion. Range of motion 0-120 degrees. Crepitus on range of motion. Tenderness medially and laterally. No instability noted. - Gait: Antalgic gait pattern on the right.  Imaging Review MRI of the left knee demonstrates a medial meniscal tear and bone-on-bone changes in the patellofemoral compartment.Plain films of the right knee demonstrate bone-on-bone changes in the medial and patellofemoral  compartments.  Assessment/Plan:  End stage arthritis, right knee   The patient history, physical examination, clinical judgment of the provider and imaging studies are consistent with end stage degenerative joint disease of the right knee and total knee arthroplasty is deemed medically necessary. The treatment options including medical management, injection therapy arthroscopy and arthroplasty were discussed at length. The risks and benefits of total knee arthroplasty were presented and reviewed. The risks due to aseptic loosening, infection, stiffness, patella tracking problems, thromboembolic complications and other imponderables were discussed. The patient acknowledged the explanation, agreed to proceed with the plan and consent was signed. Patient is being admitted for inpatient treatment for surgery, pain control, PT, OT, prophylactic antibiotics, VTE prophylaxis, progressive ambulation and ADLs and discharge planning. The patient is planning to be discharged home.   Patient's anticipated LOS is less than 2 midnights, meeting these requirements: - Has a competent adult at home to recover with post-op recover - NO history of  - Chronic pain requiring opiods  - Diabetes  - Coronary Artery Disease  - Heart failure  - Heart attack  - Stroke  - DVT/VTE  - Respiratory Failure/COPD  - Renal failure  - Anemia  - Advanced Liver disease    Therapy Plans: [facility] Disposition: Home with Brother Planned DVT Prophylaxis: 81mg  Aspirin  BID DME Needed: none  PCP: Norleen Mixer, MD (clearance received)  Cardiology: Maude Dunk, MD (clearance received)  TXA: IV Allergies: NKDA Metal Allergies: none Anesthesia Concerns: none BMI: 37.5 Last HgbA1c: not diabetic  Pharmacy: [facility] pharmacy to bring on DC Pain regimen: Oxycodone , Tramadol, methocarbomol   Other:  - Hold ASA for 7 days per PCP  - Patient was instructed on what medications to stop prior to surgery. -  Follow-up visit in 2 weeks with Dr. Melodi - Begin physical therapy following surgery - Pre-operative lab work as pre-surgical testing - Prescriptions will be provided in hospital at time of discharge  Waddell Sor, PA-C Orthopedic Surgery [facility] [place]      [1] No Known Allergies  "

## 2024-05-15 ENCOUNTER — Encounter (HOSPITAL_COMMUNITY): Payer: Self-pay

## 2024-05-15 NOTE — Progress Notes (Signed)
 " Case: [id number] Date/Time: 05/21/24 0805   Procedure: ARTHROPLASTY, KNEE, TOTAL (Right: Knee)   Anesthesia type: Choice   Pre-op diagnosis: Right knee osteoarthritis   Location: [facility] ROOM 10 / [facility]   Surgeons: Melodi Lerner, MD       DISCUSSION: Cameron Curry is a 74 yo male with PMH of A.fib s/p ablations, HTN, hypothyroid, lumbar fusion L2-5 (06/2019)  Patient follows with Cardiology at [facility]. He has had multiple ablations (2019, 2020, 2023, last in 09/2022). He has an implanted loop recorder and has been off anticoagulation due to low A.fib burden. Last seen on 04/04/24 for pre op clearance and cleared for surgery:  At this time Cameron Curry appears to be in no distress.  He has no major cardiac complaints.  He denies any angina or heart failure like symptoms.  It is reasonable for him to proceed with his surgery as scheduled.  He is aware he is at low risk from a cardiac standpoint.  He will keep his routine follow-ups.   VS: BP 139/80 Comment: left arm sitting  Pulse 68   Temp 36.5 C   Resp 20   Ht 5' 11 (1.803 m)   Wt 113.4 kg   SpO2 100%   BMI 34.87 kg/m   PROVIDERS: Levander Norleen Dover, DO Cardiologist / EP - Dr. Mabel Dunk  LABS: Labs reviewed: Acceptable for surgery. (all labs ordered are listed, but only abnormal results are displayed)  Labs Reviewed  BASIC METABOLIC PANEL WITH GFR - Abnormal; Notable for the following components:      Result Value   CO2 21 (*)    All other components within normal limits  SURGICAL PCR SCREEN  CBC    EKG 04/04/24 ([facility] - report only):  Impression Sinus rhythm  Loop recorder check 04/12/24 ([facility]):  Impression 2 AF per counters however 3 listed on episode list, 1.1% burden, 1 egms for review and episode suggestive of SR.  Hx persistent AF/AFL; s/p ablation/PVI.  Med list includes low dose ASA.  Histogram c/w previous. Next remote in 1 month. Report completed by: LE  Echo 12/30/2020  ([facility]):  Impression Left Ventricle: Systolic function is normal. EF: 55-60%. Quantitative analysis of left ventricle Global Longitudinal Strain (GLS) imaging is </= -18%, consistent with normal strain; GLS = -25.400% from the apical 4,3,2 chamber views respectively.   Left Ventricle: There is moderate concentric hypertrophy.   Left Ventricle: Doppler parameters consistent with moderate diastolic dysfunction and elevated LA pressure.   Left Atrium: Left atrium is mildly dilated.   Right Atrium: Right atrium is mildly dilated.   Aortic Valve: Mild aortic valve regurgitation.   Tricuspid Valve: There is mild regurgitation.  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   Peripheral nerve disease     Past Surgical History:  Procedure Laterality Date   ABDOMINAL EXPOSURE N/A 06/13/2019   Procedure: ABDOMINAL EXPOSURE;  Surgeon: Oris Krystal FALCON, MD;  Location: [facility] OR;  Service: Vascular;  Laterality: N/A;   ABLATION  2019   Had 3 ablations total at [facility]   ANKLE SURGERY  2014   ORIF   ANTERIOR LAT LUMBAR FUSION N/A 06/13/2019   Procedure: Oblique lumbar interbody fusion L4-5, anterior lateral lumbar fusion L2-4;  Surgeon: Burnetta Aures, MD;  Location: [facility];  Service: Orthopedics;  Laterality: N/A;   ANTERIOR LUMBAR FUSION N/A 06/13/2019   Procedure: Oblique lateral lumbar interbody fusion L4-5, extreme lateral lumbar interbody fusion L2-4;  Surgeon: Burnetta Aures, MD;  Location: [facility] OR;  Service: Orthopedics;  Laterality: N/A;  5 hrs Dr. Oris do approach Left sided tap block with exparel     CARDIOVERSION  05/02/2018   had total of 3 cardioversions at [facility]   FRACTURE SURGERY  2013   ORIF right ankle   HERNIA REPAIR  2021   umbilical hernia done at [facility]   TONSILLECTOMY      MEDICATIONS:  aspirin  EC 81 MG tablet   levothyroxine  (SYNTHROID ) 25 MCG tablet   lisinopril  (ZESTRIL ) 5 MG tablet   metoprolol  succinate (TOPROL -XL) 25 MG 24 hr tablet   Multiple Vitamin (MULTIVITAMIN WITH MINERALS) TABS tablet   No current facility-administered medications for this encounter.   Burnard CHRISTELLA Odis DEVONNA [facility] Short Stay/Anesthesiology [facility] Phone [phone] 05/15/2024 1:24 PM        "

## 2024-05-15 NOTE — Anesthesia Preprocedure Evaluation (Addendum)
"                                    Anesthesia Evaluation  Patient identified by MRN, date of birth, ID band Patient awake    Reviewed: Allergy & Precautions, H&P , NPO status , Patient's Chart, lab work & pertinent test results  Airway Mallampati: III  TM Distance: >3 FB Neck ROM: Full    Dental no notable dental hx. (+) Teeth Intact, Dental Advisory Given   Pulmonary neg pulmonary ROS   Pulmonary exam normal breath sounds clear to auscultation       Cardiovascular Exercise Tolerance: Good hypertension, Pt. on medications and Pt. on home beta blockers + dysrhythmias Atrial Fibrillation  Rhythm:Regular Rate:Normal     Neuro/Psych negative neurological ROS  negative psych ROS   GI/Hepatic negative GI ROS, Neg liver ROS,,,  Endo/Other  Hypothyroidism  Class 3 obesity  Renal/GU negative Renal ROS  negative genitourinary   Musculoskeletal  (+) Arthritis , Osteoarthritis,    Abdominal   Peds  Hematology negative hematology ROS (+)   Anesthesia Other Findings   Reproductive/Obstetrics negative OB ROS                              Anesthesia Physical Anesthesia Plan  ASA: 3  Anesthesia Plan: Spinal   Post-op Pain Management: Regional block* and Ofirmev  IV (intra-op)*   Induction: Intravenous  PONV Risk Score and Plan: 2 and Ondansetron , Dexamethasone  and Propofol  infusion  Airway Management Planned: Natural Airway and Simple Face Mask  Additional Equipment:   Intra-op Plan:   Post-operative Plan:   Informed Consent: I have reviewed the patients History and Physical, chart, labs and discussed the procedure including the risks, benefits and alternatives for the proposed anesthesia with the patient or authorized representative who has indicated his/her understanding and acceptance.     Dental advisory given  Plan Discussed with: CRNA  Anesthesia Plan Comments: (See PAT note from 12/31)         Anesthesia  Quick Evaluation  "

## 2024-05-21 ENCOUNTER — Observation Stay (HOSPITAL_COMMUNITY)
Admission: RE | Admit: 2024-05-21 | Discharge: 2024-05-22 | Disposition: A | Source: Ambulatory Visit | Attending: Orthopedic Surgery | Admitting: Orthopedic Surgery

## 2024-05-21 ENCOUNTER — Encounter (HOSPITAL_COMMUNITY): Payer: Self-pay | Admitting: Orthopedic Surgery

## 2024-05-21 ENCOUNTER — Ambulatory Visit (HOSPITAL_COMMUNITY): Payer: Self-pay | Admitting: Anesthesiology

## 2024-05-21 ENCOUNTER — Ambulatory Visit (HOSPITAL_COMMUNITY): Payer: Self-pay | Admitting: Medical

## 2024-05-21 ENCOUNTER — Other Ambulatory Visit: Payer: Self-pay

## 2024-05-21 ENCOUNTER — Encounter (HOSPITAL_COMMUNITY): Admission: RE | Payer: Self-pay | Source: Ambulatory Visit

## 2024-05-21 DIAGNOSIS — E039 Hypothyroidism, unspecified: Secondary | ICD-10-CM | POA: Diagnosis not present

## 2024-05-21 DIAGNOSIS — M25561 Pain in right knee: Secondary | ICD-10-CM | POA: Diagnosis present

## 2024-05-21 DIAGNOSIS — M1711 Unilateral primary osteoarthritis, right knee: Secondary | ICD-10-CM | POA: Diagnosis not present

## 2024-05-21 DIAGNOSIS — Z79899 Other long term (current) drug therapy: Secondary | ICD-10-CM | POA: Diagnosis not present

## 2024-05-21 DIAGNOSIS — M1712 Unilateral primary osteoarthritis, left knee: Secondary | ICD-10-CM | POA: Diagnosis not present

## 2024-05-21 DIAGNOSIS — I10 Essential (primary) hypertension: Secondary | ICD-10-CM | POA: Insufficient documentation

## 2024-05-21 DIAGNOSIS — M170 Bilateral primary osteoarthritis of knee: Secondary | ICD-10-CM | POA: Diagnosis not present

## 2024-05-21 DIAGNOSIS — M179 Osteoarthritis of knee, unspecified: Principal | ICD-10-CM | POA: Diagnosis present

## 2024-05-21 HISTORY — PX: TOTAL KNEE ARTHROPLASTY: SHX125

## 2024-05-21 MED ORDER — DIPHENHYDRAMINE HCL 12.5 MG/5ML PO ELIX: 12.5000 mg | ORAL_SOLUTION | ORAL | Status: DC | PRN

## 2024-05-21 MED ORDER — ACETAMINOPHEN 325 MG PO TABS: 325.0000 mg | ORAL_TABLET | Freq: Four times a day (QID) | ORAL | Status: DC | PRN

## 2024-05-21 MED ORDER — ONDANSETRON HCL 4 MG/2ML IJ SOLN
INTRAMUSCULAR | Status: DC | PRN
  Administered 2024-05-21: 4 mg via INTRAVENOUS

## 2024-05-21 MED ORDER — SODIUM CHLORIDE 0.9 % IR SOLN
Status: DC | PRN
  Administered 2024-05-21: 1000 mL

## 2024-05-21 MED ORDER — ORAL CARE MOUTH RINSE: 15.0000 mL | Freq: Once | OROMUCOSAL | Status: AC

## 2024-05-21 MED ORDER — BUPIVACAINE LIPOSOME 1.3 % IJ SUSP: 20.0000 mL | Freq: Once | INTRAMUSCULAR | Status: DC

## 2024-05-21 MED ORDER — FLEET ENEMA RE ENEM: 1.0000 | ENEMA | Freq: Once | RECTAL | Status: DC | PRN

## 2024-05-21 MED ORDER — HYDROMORPHONE HCL 1 MG/ML IJ SOLN: 0.5000 mg | INTRAMUSCULAR | Status: DC | PRN

## 2024-05-21 MED ORDER — MEPIVACAINE HCL (PF) 2 % IJ SOLN
INTRAMUSCULAR | Status: DC | PRN
  Administered 2024-05-21: 3 mL via INTRATHECAL

## 2024-05-21 MED ORDER — POLYETHYLENE GLYCOL 3350 17 G PO PACK: 17.0000 g | PACK | Freq: Every day | ORAL | Status: DC | PRN

## 2024-05-21 MED ORDER — MENTHOL 3 MG MT LOZG: 1.0000 | LOZENGE | OROMUCOSAL | Status: DC | PRN

## 2024-05-21 MED ORDER — BISACODYL 10 MG RE SUPP: 10.0000 mg | Freq: Every day | RECTAL | Status: DC | PRN

## 2024-05-21 MED ORDER — CEFAZOLIN SODIUM-DEXTROSE 2-4 GM/100ML-% IV SOLN
2.0000 g | INTRAVENOUS | Status: AC
  Administered 2024-05-21: 2 g via INTRAVENOUS
  Filled 2024-05-21: qty 100

## 2024-05-21 MED ORDER — STERILE WATER FOR IRRIGATION IR SOLN
Status: DC | PRN
  Administered 2024-05-21: 2000 mL

## 2024-05-21 MED ORDER — DOCUSATE SODIUM 100 MG PO CAPS
100.0000 mg | ORAL_CAPSULE | Freq: Two times a day (BID) | ORAL | Status: DC
  Administered 2024-05-21 – 2024-05-22 (×2): 100 mg via ORAL
  Filled 2024-05-21 (×2): qty 1

## 2024-05-21 MED ORDER — FENTANYL CITRATE (PF) 50 MCG/ML IJ SOSY
25.0000 ug | PREFILLED_SYRINGE | Freq: Once | INTRAMUSCULAR | Status: AC
  Administered 2024-05-21: 50 ug via INTRAVENOUS

## 2024-05-21 MED ORDER — PHENYLEPHRINE HCL-NACL 20-0.9 MG/250ML-% IV SOLN
INTRAVENOUS | Status: AC
  Filled 2024-05-21: qty 250

## 2024-05-21 MED ORDER — METHYLPREDNISOLONE ACETATE 40 MG/ML IJ SUSP
INTRAMUSCULAR | Status: AC
  Filled 2024-05-21: qty 2

## 2024-05-21 MED ORDER — PHENYLEPHRINE HCL-NACL 20-0.9 MG/250ML-% IV SOLN
INTRAVENOUS | Status: DC | PRN
  Administered 2024-05-21: 25 ug/min via INTRAVENOUS

## 2024-05-21 MED ORDER — CHLORHEXIDINE GLUCONATE 0.12 % MT SOLN
15.0000 mL | Freq: Once | OROMUCOSAL | Status: AC
  Administered 2024-05-21: 15 mL via OROMUCOSAL

## 2024-05-21 MED ORDER — ONDANSETRON HCL 4 MG/2ML IJ SOLN: 4.0000 mg | Freq: Four times a day (QID) | INTRAMUSCULAR | Status: DC | PRN

## 2024-05-21 MED ORDER — METOCLOPRAMIDE HCL 5 MG PO TABS: 5.0000 mg | ORAL_TABLET | Freq: Three times a day (TID) | ORAL | Status: DC | PRN

## 2024-05-21 MED ORDER — ACETAMINOPHEN 500 MG PO TABS
1000.0000 mg | ORAL_TABLET | Freq: Four times a day (QID) | ORAL | Status: AC
  Administered 2024-05-21 – 2024-05-22 (×4): 1000 mg via ORAL
  Filled 2024-05-21 (×4): qty 2

## 2024-05-21 MED ORDER — METHYLPREDNISOLONE ACETATE 40 MG/ML IJ SUSP
INTRAMUSCULAR | Status: DC | PRN
  Administered 2024-05-21: 80 mg

## 2024-05-21 MED ORDER — FENTANYL CITRATE (PF) 50 MCG/ML IJ SOSY: 25.0000 ug | PREFILLED_SYRINGE | INTRAMUSCULAR | Status: DC | PRN

## 2024-05-21 MED ORDER — 0.9 % SODIUM CHLORIDE (POUR BTL) OPTIME
TOPICAL | Status: DC | PRN
  Administered 2024-05-21: 1000 mL

## 2024-05-21 MED ORDER — METOPROLOL SUCCINATE ER 25 MG PO TB24
25.0000 mg | ORAL_TABLET | Freq: Every day | ORAL | Status: DC
  Administered 2024-05-22: 25 mg via ORAL
  Filled 2024-05-21 (×2): qty 1

## 2024-05-21 MED ORDER — MIDAZOLAM HCL (PF) 2 MG/2ML IJ SOLN: 1.0000 mg | Freq: Once | INTRAMUSCULAR | Status: DC

## 2024-05-21 MED ORDER — ONDANSETRON HCL 4 MG PO TABS: 4.0000 mg | ORAL_TABLET | Freq: Four times a day (QID) | ORAL | Status: DC | PRN

## 2024-05-21 MED ORDER — DEXAMETHASONE SOD PHOSPHATE PF 10 MG/ML IJ SOLN
8.0000 mg | Freq: Once | INTRAMUSCULAR | Status: AC
  Administered 2024-05-21: 8 mg via INTRAVENOUS

## 2024-05-21 MED ORDER — SODIUM CHLORIDE (PF) 0.9 % IJ SOLN
INTRAMUSCULAR | Status: DC | PRN
  Administered 2024-05-21: 60 mL

## 2024-05-21 MED ORDER — ACETAMINOPHEN 10 MG/ML IV SOLN
1000.0000 mg | Freq: Four times a day (QID) | INTRAVENOUS | Status: DC
  Administered 2024-05-21: 1000 mg via INTRAVENOUS
  Filled 2024-05-21: qty 100

## 2024-05-21 MED ORDER — OXYCODONE HCL 5 MG PO TABS
5.0000 mg | ORAL_TABLET | ORAL | Status: DC | PRN
  Administered 2024-05-21: 5 mg via ORAL
  Administered 2024-05-22 (×3): 10 mg via ORAL
  Filled 2024-05-21 (×3): qty 2
  Filled 2024-05-21: qty 1

## 2024-05-21 MED ORDER — TRANEXAMIC ACID-NACL 1000-0.7 MG/100ML-% IV SOLN
1000.0000 mg | INTRAVENOUS | Status: AC
  Administered 2024-05-21: 1000 mg via INTRAVENOUS
  Filled 2024-05-21: qty 100

## 2024-05-21 MED ORDER — METHOCARBAMOL 500 MG PO TABS
500.0000 mg | ORAL_TABLET | Freq: Four times a day (QID) | ORAL | Status: DC | PRN
  Administered 2024-05-21 – 2024-05-22 (×2): 500 mg via ORAL
  Filled 2024-05-21 (×2): qty 1

## 2024-05-21 MED ORDER — SODIUM CHLORIDE (PF) 0.9 % IJ SOLN
INTRAMUSCULAR | Status: AC
  Filled 2024-05-21: qty 10

## 2024-05-21 MED ORDER — BUPIVACAINE LIPOSOME 1.3 % IJ SUSP
INTRAMUSCULAR | Status: AC
  Filled 2024-05-21: qty 20

## 2024-05-21 MED ORDER — BUPIVACAINE LIPOSOME 1.3 % IJ SUSP
INTRAMUSCULAR | Status: DC | PRN
  Administered 2024-05-21: 20 mL

## 2024-05-21 MED ORDER — LACTATED RINGERS IV SOLN: INTRAVENOUS | Status: DC | PRN

## 2024-05-21 MED ORDER — FENTANYL CITRATE (PF) 50 MCG/ML IJ SOSY
PREFILLED_SYRINGE | INTRAMUSCULAR | Status: AC
  Filled 2024-05-21: qty 2

## 2024-05-21 MED ORDER — SODIUM CHLORIDE 0.9 % IV SOLN: INTRAVENOUS | Status: DC

## 2024-05-21 MED ORDER — METHOCARBAMOL 1000 MG/10ML IJ SOLN: 500.0000 mg | Freq: Four times a day (QID) | INTRAMUSCULAR | Status: DC | PRN

## 2024-05-21 MED ORDER — LEVOTHYROXINE SODIUM 25 MCG PO TABS
25.0000 ug | ORAL_TABLET | Freq: Every day | ORAL | Status: DC
  Administered 2024-05-22: 25 ug via ORAL
  Filled 2024-05-21: qty 1

## 2024-05-21 MED ORDER — CEFAZOLIN SODIUM-DEXTROSE 2-4 GM/100ML-% IV SOLN
2.0000 g | Freq: Four times a day (QID) | INTRAVENOUS | Status: AC
  Administered 2024-05-21 (×2): 2 g via INTRAVENOUS
  Filled 2024-05-21 (×2): qty 100

## 2024-05-21 MED ORDER — PROPOFOL 500 MG/50ML IV EMUL
INTRAVENOUS | Status: DC | PRN
  Administered 2024-05-21: 125 ug/kg/min via INTRAVENOUS

## 2024-05-21 MED ORDER — TRAMADOL HCL 50 MG PO TABS
50.0000 mg | ORAL_TABLET | Freq: Four times a day (QID) | ORAL | Status: DC | PRN
  Administered 2024-05-21: 100 mg via ORAL
  Filled 2024-05-21: qty 2

## 2024-05-21 MED ORDER — POVIDONE-IODINE 10 % EX SWAB: 2.0000 | Freq: Once | CUTANEOUS | Status: DC

## 2024-05-21 MED ORDER — PHENOL 1.4 % MT LIQD: 1.0000 | OROMUCOSAL | Status: DC | PRN

## 2024-05-21 MED ORDER — DEXMEDETOMIDINE HCL IN NACL 80 MCG/20ML IV SOLN
INTRAVENOUS | Status: DC | PRN
  Administered 2024-05-21: 8 ug via INTRAVENOUS

## 2024-05-21 MED ORDER — MIDAZOLAM HCL 2 MG/2ML IJ SOLN
INTRAMUSCULAR | Status: AC
  Filled 2024-05-21: qty 2

## 2024-05-21 MED ORDER — DEXAMETHASONE SOD PHOSPHATE PF 10 MG/ML IJ SOLN
10.0000 mg | Freq: Once | INTRAMUSCULAR | Status: AC
  Administered 2024-05-22: 10 mg via INTRAVENOUS
  Filled 2024-05-21: qty 1

## 2024-05-21 MED ORDER — PROPOFOL 1000 MG/100ML IV EMUL
INTRAVENOUS | Status: AC
  Filled 2024-05-21: qty 100

## 2024-05-21 MED ORDER — LACTATED RINGERS IV SOLN: INTRAVENOUS | Status: DC

## 2024-05-21 MED ORDER — METOCLOPRAMIDE HCL 5 MG/ML IJ SOLN: 5.0000 mg | Freq: Three times a day (TID) | INTRAMUSCULAR | Status: DC | PRN

## 2024-05-21 MED ORDER — SODIUM CHLORIDE (PF) 0.9 % IJ SOLN
INTRAMUSCULAR | Status: AC
  Filled 2024-05-21: qty 50

## 2024-05-21 MED ORDER — ASPIRIN 81 MG PO CHEW
81.0000 mg | CHEWABLE_TABLET | Freq: Two times a day (BID) | ORAL | Status: DC
  Administered 2024-05-22: 81 mg via ORAL
  Filled 2024-05-21: qty 1

## 2024-05-21 MED ORDER — BUPIVACAINE-EPINEPHRINE (PF) 0.5% -1:200000 IJ SOLN
INTRAMUSCULAR | Status: DC | PRN
  Administered 2024-05-21: 20 mL via PERINEURAL

## 2024-05-21 NOTE — Anesthesia Procedure Notes (Signed)
 Spinal  Patient location during procedure: OR Start time: 05/21/2024 8:12 AM End time: 05/21/2024 8:17 AM Reason for block: surgical anesthesia  Staffing Performed: anesthesiologist  Authorized by: Epifanio Fallow, MD   Performed by: Epifanio Fallow, MD  Preanesthetic Checklist Completed: patient identified, IV checked, risks and benefits discussed, surgical consent, monitors and equipment checked, pre-op evaluation and timeout performed Spinal Block Patient position: sitting Prep: DuraPrep Patient monitoring: cardiac monitor, continuous pulse ox and blood pressure Approach: midline Location: L3-4 Injection technique: single-shot Needle Needle type: Quincke  Needle gauge: 22 G Needle length: 9 cm Assessment Sensory level: T8 Events: CSF return  Additional Notes Functioning IV was confirmed and monitors were applied. Sterile prep and drape, including hand hygiene and sterile gloves were used. The patient was positioned and the spine was prepped. The skin was anesthetized with lidocaine .  Free flow of clear CSF was obtained prior to injecting local anesthetic into the CSF.  The spinal needle aspirated freely following injection.  The needle was carefully withdrawn.  The patient tolerated the procedure well.

## 2024-05-21 NOTE — Discharge Instructions (Signed)
 Frank Aluisio, MD Total Joint Specialist [facility] [address] [place], [place] [address] [phone]  TOTAL KNEE REPLACEMENT POSTOPERATIVE DIRECTIONS    Knee Rehabilitation, Guidelines Following Surgery  Results after knee surgery are often greatly improved when you follow the exercise, range of motion and muscle strengthening exercises prescribed by your doctor. Safety measures are also important to protect the knee from further injury. If any of these exercises cause you to have increased pain or swelling in your knee joint, decrease the amount until you are comfortable again and slowly increase them. If you have problems or questions, call your caregiver or physical therapist for advice.   BLOOD CLOT PREVENTION Take an 81 mg Aspirin two times a day for three weeks following surgery. Then resume one 81 mg Aspirin once a day. You may resume your vitamins/supplements upon discharge from the hospital. Do not take any NSAIDs (Advil, Aleve, Ibuprofen, Meloxicam, etc.) until you are 3 weeks out from surgery  HOME CARE INSTRUCTIONS  Remove items at home which could result in a fall. This includes throw rugs or furniture in walking pathways.  ICE to the affected knee as much as tolerated. Icing helps control swelling. If the swelling is well controlled you will be more comfortable and rehab easier. Continue to use ice on the knee for pain and swelling from surgery. You may notice swelling that will progress down to the foot and ankle. This is normal after surgery. Elevate the leg when you are not up walking on it.    Continue to use the breathing machine which will help keep your temperature down. It is common for your temperature to cycle up and down following surgery, especially at night when you are not up moving around and exerting yourself. The breathing machine keeps your lungs expanded and your temperature down. Do not place pillow under the operative  knee, focus on keeping the knee straight while resting  DIET You may resume your previous home diet once you are discharged from the hospital.  DRESSING / WOUND CARE / SHOWERING Keep your bulky bandage on for 2 days. On the third post-operative day you may remove the Ace bandage and gauze. There is a waterproof adhesive bandage on your skin which will stay in place until your first follow-up appointment. Once you remove this you will not need to place another bandage You may begin showering 3 days following surgery, but do not submerge the incision under water.  ACTIVITY For the first 5 days, the key is rest and control of pain and swelling Do your home exercises twice a day starting on post-operative day 3. On the days you go to physical therapy, just do the home exercises once that day. You should rest, ice and elevate the leg for 50 minutes out of every hour. Get up and walk/stretch for 10 minutes per hour. After 5 days you can increase your activity slowly as tolerated. Walk with your walker as instructed. Use the walker until you are comfortable transitioning to a cane. Walk with the cane in the opposite hand of the operative leg. You may discontinue the cane once you are comfortable and walking steadily. Avoid periods of inactivity such as sitting longer than an hour when not asleep. This helps prevent blood clots.  You may discontinue the knee immobilizer once you are able to perform a straight leg raise while lying down. You may resume a sexual relationship in one month or when given the OK by   your doctor.  You may return to work once you are cleared by your doctor.  Do not drive a car for 6 weeks or until released by your surgeon.  Do not drive while taking narcotics.  TED HOSE STOCKINGS Wear the elastic stockings on both legs for three weeks following surgery during the day. You may remove them at night for sleeping.  WEIGHT BEARING Weight bearing as tolerated with assist device  (walker, cane, etc) as directed, use it as long as suggested by your surgeon or therapist, typically at least 4-6 weeks.  POSTOPERATIVE CONSTIPATION PROTOCOL Constipation - defined medically as fewer than three stools per week and severe constipation as less than one stool per week.  One of the most common issues patients have following surgery is constipation.  Even if you have a regular bowel pattern at home, your normal regimen is likely to be disrupted due to multiple reasons following surgery.  Combination of anesthesia, postoperative narcotics, change in appetite and fluid intake all can affect your bowels.  In order to avoid complications following surgery, here are some recommendations in order to help you during your recovery period.  Colace (docusate) - Pick up an over-the-counter form of Colace or another stool softener and take twice a day as long as you are requiring postoperative pain medications.  Take with a full glass of water daily.  If you experience loose stools or diarrhea, hold the colace until you stool forms back up. If your symptoms do not get better within 1 week or if they get worse, check with your doctor. Dulcolax (bisacodyl) - Pick up over-the-counter and take as directed by the product packaging as needed to assist with the movement of your bowels.  Take with a full glass of water.  Use this product as needed if not relieved by Colace only.  MiraLax (polyethylene glycol) - Pick up over-the-counter to have on hand. MiraLax is a solution that will increase the amount of water in your bowels to assist with bowel movements.  Take as directed and can mix with a glass of water, juice, soda, coffee, or tea. Take if you go more than two days without a movement. Do not use MiraLax more than once per day. Call your doctor if you are still constipated or irregular after using this medication for 7 days in a row.  If you continue to have problems with postoperative constipation, please  contact the office for further assistance and recommendations.  If you experience "the worst abdominal pain ever" or develop nausea or vomiting, please contact the office immediatly for further recommendations for treatment.  ITCHING If you experience itching with your medications, try taking only a single pain pill, or even half a pain pill at a time.  You can also use Benadryl over the counter for itching or also to help with sleep.   MEDICATIONS See your medication summary on the "After Visit Summary" that the nursing staff will review with you prior to discharge.  You may have some home medications which will be placed on hold until you complete the course of blood thinner medication.  It is important for you to complete the blood thinner medication as prescribed by your surgeon.  Continue your approved medications as instructed at time of discharge.  PRECAUTIONS If you experience chest pain or shortness of breath - call 911 immediately for transfer to the hospital emergency department.  If you develop a fever greater that 101 F, purulent drainage from wound, increased redness   or drainage from wound, foul odor from the wound/dressing, or calf pain - CONTACT YOUR SURGEON.                                                   FOLLOW-UP APPOINTMENTS Make sure you keep all of your appointments after your operation with your surgeon and caregivers. You should call the office at the above phone number and make an appointment for approximately two weeks after the date of your surgery or on the date instructed by your surgeon outlined in the "After Visit Summary".  RANGE OF MOTION AND STRENGTHENING EXERCISES  Rehabilitation of the knee is important following a knee injury or an operation. After just a few days of immobilization, the muscles of the thigh which control the knee become weakened and shrink (atrophy). Knee exercises are designed to build up the tone and strength of the thigh muscles and to  improve knee motion. Often times heat used for twenty to thirty minutes before working out will loosen up your tissues and help with improving the range of motion but do not use heat for the first two weeks following surgery. These exercises can be done on a training (exercise) mat, on the floor, on a table or on a bed. Use what ever works the best and is most comfortable for you Knee exercises include:  Leg Lifts - While your knee is still immobilized in a splint or cast, you can do straight leg raises. Lift the leg to 60 degrees, hold for 3 sec, and slowly lower the leg. Repeat 10-20 times 2-3 times daily. Perform this exercise against resistance later as your knee gets better.  Quad and Hamstring Sets - Tighten up the muscle on the front of the thigh (Quad) and hold for 5-10 sec. Repeat this 10-20 times hourly. Hamstring sets are done by pushing the foot backward against an object and holding for 5-10 sec. Repeat as with quad sets.  Leg Slides: Lying on your back, slowly slide your foot toward your buttocks, bending your knee up off the floor (only go as far as is comfortable). Then slowly slide your foot back down until your leg is flat on the floor again. Angel Wings: Lying on your back spread your legs to the side as far apart as you can without causing discomfort.  A rehabilitation program following serious knee injuries can speed recovery and prevent re-injury in the future due to weakened muscles. Contact your doctor or a physical therapist for more information on knee rehabilitation.   POST-OPERATIVE OPIOID TAPER INSTRUCTIONS: It is important to wean off of your opioid medication as soon as possible. If you do not need pain medication after your surgery it is ok to stop day one. Opioids include: Codeine, Hydrocodone(Norco, Vicodin), Oxycodone(Percocet, oxycontin) and hydromorphone amongst others.  Long term and even short term use of opiods can cause: Increased pain  response Dependence Constipation Depression Respiratory depression And more.  Withdrawal symptoms can include Flu like symptoms Nausea, vomiting And more Techniques to manage these symptoms Hydrate well Eat regular healthy meals Stay active Use relaxation techniques(deep breathing, meditating, yoga) Do Not substitute Alcohol to help with tapering If you have been on opioids for less than two weeks and do not have pain than it is ok to stop all together.  Plan to wean off of opioids This   plan should start within one week post op of your joint replacement. Maintain the same interval or time between taking each dose and first decrease the dose.  Cut the total daily intake of opioids by one tablet each day Next start to increase the time between doses. The last dose that should be eliminated is the evening dose.   IF YOU ARE TRANSFERRED TO A SKILLED REHAB FACILITY If the patient is transferred to a skilled rehab facility following release from the hospital, a list of the current medications will be sent to the facility for the patient to continue.  When discharged from the skilled rehab facility, please have the facility set up the patient's Home Health Physical Therapy prior to being released. Also, the skilled facility will be responsible for providing the patient with their medications at time of release from the facility to include their pain medication, the muscle relaxants, and their blood thinner medication. If the patient is still at the rehab facility at time of the two week follow up appointment, the skilled rehab facility will also need to assist the patient in arranging follow up appointment in our office and any transportation needs.  MAKE SURE YOU:  Understand these instructions.  Get help right away if you are not doing well or get worse.   DENTAL ANTIBIOTICS:  In most cases prophylactic antibiotics for Dental procdeures after total joint surgery are not  necessary.  Exceptions are as follows:  1. History of prior total joint infection  2. Severely immunocompromised (Organ Transplant, cancer chemotherapy, Rheumatoid biologic meds such as Humera)  3. Poorly controlled diabetes (A1C &gt; 8.0, blood glucose over 200)  If you have one of these conditions, contact your surgeon for an antibiotic prescription, prior to your dental procedure.    Pick up stool softner and laxative for home use following surgery while on pain medications. Do not submerge incision under water. Please use good hand washing techniques while changing dressing each day. May shower starting three days after surgery. Please use a clean towel to pat the incision dry following showers. Continue to use ice for pain and swelling after surgery. Do not use any lotions or creams on the incision until instructed by your surgeon.  

## 2024-05-21 NOTE — Interval H&P Note (Signed)
 History and Physical Interval Note:  05/21/2024 6:39 AM  Cameron Curry  has presented today for surgery, with the diagnosis of Right knee osteoarthritis.  The various methods of treatment have been discussed with the patient and family. After consideration of risks, benefits and other options for treatment, the patient has consented to  Procedures: ARTHROPLASTY, KNEE, TOTAL (Right) as a surgical intervention.  The patient's history has been reviewed, patient examined, no change in status, stable for surgery.  I have reviewed the patient's chart and labs.  Questions were answered to the patient's satisfaction.     Dempsey Breckin Zafar

## 2024-05-21 NOTE — Progress Notes (Signed)
 Orthopedic Tech Progress Note Patient Details:  Cameron Curry 01-27-1951 [id number] Applied CPM per order. Will remove at 2:14 pm.  CPM Right Knee CPM Right Knee: On Right Knee Flexion (Degrees): 40 Right Knee Extension (Degrees): 10  Post Interventions Patient Tolerated: Well Instructions Provided: Adjustment of device, Care of device, Poper ambulation with device Ortho Devices Type of Ortho Device: CPM padding Ortho Device/Splint Location: RLE Ortho Device/Splint Interventions: Ordered, Application, Adjustment   Post Interventions Patient Tolerated: Well Instructions Provided: Adjustment of device, Care of device, Poper ambulation with device  [name] 05/21/2024, 10:18 AM

## 2024-05-21 NOTE — Anesthesia Procedure Notes (Signed)
 Anesthesia Regional Block: Adductor canal block   Pre-Anesthetic Checklist: , timeout performed,  Correct Patient, Correct Site, Correct Laterality,  Correct Procedure, Correct Position, site marked,  Risks and benefits discussed,  Pre-op evaluation,  At surgeon's request and post-op pain management  Laterality: Right  Prep: Maximum Sterile Barrier Precautions used, chloraprep       Needles:  Injection technique: Single-shot  Needle Type: Echogenic Stimulator Needle     Needle Length: 9cm  Needle Gauge: 21     Additional Needles:   Procedures:,,,, ultrasound used (permanent image in chart),,    Narrative:  Start time: 05/21/2024 7:19 AM End time: 05/21/2024 7:29 AM Injection made incrementally with aspirations every 5 mL.  Performed by: Personally  Anesthesiologist: Epifanio Fallow, MD  Additional Notes:

## 2024-05-21 NOTE — Transfer of Care (Signed)
 Immediate Anesthesia Transfer of Care Note  Patient: Cameron Curry  Procedure(s) Performed: ARTHROPLASTY, KNEE, TOTAL (Right: Knee)  Patient Location: PACU  Anesthesia Type:MAC combined with regional for post-op pain  Level of Consciousness: awake and alert   Airway & Oxygen Therapy: Patient Spontanous Breathing and Patient connected to nasal cannula oxygen  Post-op Assessment: Report given to RN and Post -op Vital signs reviewed and stable  Post vital signs: Reviewed and stable  Last Vitals:  Vitals Value Taken Time  BP 104/63 05/21/24 09:55  Temp    Pulse 89 05/21/24 09:57  Resp 18 05/21/24 09:57  SpO2 95 % 05/21/24 09:57  Vitals shown include unfiled device data.  Last Pain:  Vitals:   05/21/24 0745  TempSrc:   PainSc: 0-No pain      Patients Stated Pain Goal: 4 (05/21/24 0636)  Complications: No notable events documented.

## 2024-05-21 NOTE — Progress Notes (Signed)
 Orthopedic Tech Progress Note Patient Details:  Cameron Curry 08-17-1950 [id number]  Patient ID: Cameron Curry, male   DOB: Apr 20, 1951, 74 y.o.   MRN: [id number] Removed CPM from pt.  [name] 05/21/2024, 2:30 PM

## 2024-05-21 NOTE — Evaluation (Addendum)
 Physical Therapy Evaluation Patient Details Name: Cameron Curry MRN: [id number] DOB: 08/08/1950 Today's Date: 05/21/2024  History of Present Illness  74 yo male s/p R TKA on 05/21/24. PMH: arthritis, afib,  bil LE weakness, DDD, HTN, LBP, R ankle ORIF, ALIF L2-5 2021, umbilical hernia repair.  Clinical Impression  Pt is s/p TKA resulting in the deficits listed below (see PT Problem List).   Pt amb 50' with RW, CGA for safety.  Pt states he will  have assist getting into his house but will likely be alone at home, concerned about his level of Independence at d/c. Anticipate pt will progress steadily in acute setting Pt will benefit from acute skilled PT to increase their independence and safety with mobility to allow discharge.          If plan is discharge home, recommend the following: A little help with walking and/or transfers;A little help with bathing/dressing/bathroom;Assistance with cooking/housework;Help with stairs or ramp for entrance;Assist for transportation   Can travel by private vehicle        Equipment Recommendations Rolling walker (2 wheels) (pt has 2 rollators)  Recommendations for Other Services       Functional Status Assessment Patient has had a recent decline in their functional status and demonstrates the ability to make significant improvements in function in a reasonable and predictable amount of time.     Precautions / Restrictions Precautions Precautions: Fall;Knee Precaution/Restrictions Comments: KI utilized op day d/t quad lag Required Braces or Orthoses: Knee Immobilizer - Right Knee Immobilizer - Right: Discontinue once straight leg raise with < 10 degree lag Restrictions Weight Bearing Restrictions Per Provider Order: No RLE Weight Bearing Per Provider Order: Weight bearing as tolerated      Mobility  Bed Mobility Overal bed mobility: Needs Assistance Bed Mobility: Supine to Sit     Supine to sit: Contact guard, Min assist      General bed mobility comments: assist to initiate RLE progression off EOB, pt able to complete with CGA for safety    Transfers Overall transfer level: Needs assistance Equipment used: Rolling walker (2 wheels) Transfers: Sit to/from Stand Sit to Stand: Min assist, From elevated surface           General transfer comment: cues for hand placement and RLE Position    Ambulation/Gait    Pt amb ~ 32' with RW, CGA for safety. Cues for sequence and shorter steps d/t R quad lag/decr quad activation, likely d/t adductor block              Stairs            Wheelchair Mobility     Tilt Bed    Modified Rankin (Stroke Patients Only)       Balance                                             Pertinent Vitals/Pain Pain Assessment Pain Assessment: 0-10 Pain Score: 5  Pain Location: right knee Pain Descriptors / Indicators: Burning, Sore Pain Intervention(s): Limited activity within patient's tolerance, Monitored during session, Premedicated before session    Home Living Family/patient expects to be discharged to:: Private residence Living Arrangements: Alone Available Help at Discharge: Family Type of Home: House Home Access: Ramped entrance;Stairs to enter Entrance Stairs-Rails: Doctor, [facility] of Steps: 3   Home Layout: Two level;Able to live on main  level with bedroom/bathroom Home Equipment: Cane - single point;Crutches;Rollator (4 wheels)      Prior Function Prior Level of Function : Independent/Modified Independent;Driving                     Extremity/Trunk Assessment   Upper Extremity Assessment Upper Extremity Assessment: Overall WFL for tasks assessed    Lower Extremity Assessment Lower Extremity Assessment: RLE deficits/detail RLE Deficits / Details: ankle WFL, Knee extension and hip flexion 3/5, with ~ 15-20 degree quad lag, anticipated post op deficits       Communication    Communication Communication: Impaired Factors Affecting Communication: Hearing impaired    Cognition Arousal: Alert Behavior During Therapy: WFL for tasks assessed/performed   PT - Cognitive impairments: No apparent impairments                         Following commands: Intact       Cueing Cueing Techniques: Verbal cues     General Comments      Exercises Total Joint Exercises Ankle Circles/Pumps: PROM, Both, 10 reps   Assessment/Plan    PT Assessment Patient needs continued PT services  PT Problem List Decreased strength;Decreased range of motion;Decreased activity tolerance;Decreased balance;Decreased mobility;Decreased knowledge of use of DME;Pain       PT Treatment Interventions DME instruction;Therapeutic exercise;Gait training;Functional mobility training;Therapeutic activities;Patient/family education;Stair training    PT Goals (Current goals can be found in the Care Plan section)  Acute Rehab PT Goals Patient Stated Goal: get better and have other knee done PT Goal Formulation: With patient Time For Goal Achievement: 05/28/24 Potential to Achieve Goals: Good    Frequency 7X/week     Co-evaluation               AM-PAC PT 6 Clicks Mobility  Outcome Measure Help needed turning from your back to your side while in a flat bed without using bedrails?: A Little Help needed moving from lying on your back to sitting on the side of a flat bed without using bedrails?: A Little Help needed moving to and from a bed to a chair (including a wheelchair)?: A Little Help needed standing up from a chair using your arms (e.g., wheelchair or bedside chair)?: A Little Help needed to walk in hospital room?: A Little Help needed climbing 3-5 steps with a railing? : A Lot 6 Click Score: 17    End of Session Equipment Utilized During Treatment: Gait belt;Right knee immobilizer Activity Tolerance: Patient tolerated treatment well Patient left: in chair;with  call bell/phone within reach;with chair alarm set Nurse Communication: Mobility status PT Visit Diagnosis: Other abnormalities of gait and mobility (R26.89)    Time: 8545-8485 PT Time Calculation (min) (ACUTE ONLY): 20 min   Charges:   PT Evaluation $PT Eval Low Complexity: 1 Low   PT General Charges $$ ACUTE PT VISIT: 1 Visit         Migdalia Olejniczak, PT  Acute Rehab Dept [facility]) [phone]  05/21/2024   [facility] 05/21/2024, 3:31 PM

## 2024-05-21 NOTE — Anesthesia Postprocedure Evaluation (Signed)
"   Anesthesia Post Note  Patient: Cameron Curry  Procedure(s) Performed: ARTHROPLASTY, KNEE, TOTAL (Right: Knee)     Patient location during evaluation: PACU Anesthesia Type: Spinal and Regional Level of consciousness: oriented and awake and alert Pain management: pain level controlled Vital Signs Assessment: post-procedure vital signs reviewed and stable Respiratory status: spontaneous breathing and respiratory function stable Cardiovascular status: blood pressure returned to baseline and stable Postop Assessment: no headache, no backache, no apparent nausea or vomiting, spinal receding and patient able to bend at knees Anesthetic complications: no   No notable events documented.  Last Vitals:  Vitals:   05/21/24 1045 05/21/24 1100  BP: 103/77 107/66  Pulse: (!) 54 (!) 59  Resp: 18 20  Temp:    SpO2: 95% 97%    Last Pain:  Vitals:   05/21/24 1100  TempSrc:   PainSc: 0-No pain    LLE Motor Response: Purposeful movement (05/21/24 1100)   RLE Motor Response: Purposeful movement (05/21/24 1100)   L Sensory Level: L5-Outer lower leg, top of foot, great toe (05/21/24 1100) R Sensory Level: L4-Anterior knee, lower leg (05/21/24 1100)  Dariana Garbett,W. EDMOND      "

## 2024-05-21 NOTE — Op Note (Signed)
 "    OPERATIVE REPORT-TOTAL KNEE ARTHROPLASTY   Pre-operative diagnosis- Osteoarthritis  Bilateral knee(s)  Post-operative diagnosis- Osteoarthritis Bilateral knee(s)  Procedure-  Right  Total Knee Arthroplasty   Left knee cortisone injection  Surgeon- Cameron GAILS. Octa Uplinger, MD  Assistant- Waddell Sor, PA-C   Anesthesia-  Adductor canal block and spinal  EBL- 25 ml   Drains None  Tourniquet time-  Total Tourniquet Time Documented: Thigh (Right) - 39 minutes Total: Thigh (Right) - 39 minutes     Complications- None  Condition-PACU - hemodynamically stable.   Brief Clinical Note  Cameron Curry is a 74 y.o. year old male with end stage OA of his right knee with progressively worsening pain and dysfunction. He has constant pain, with activity and at rest and significant functional deficits with difficulties even with ADLs. He has had extensive non-op management including analgesics, injections of cortisone and viscosupplements, and home exercise program, but remains in significant pain with significant dysfunction. Radiographs show bone on bone arthritis medial. He presents now for right Total Knee Arthroplasty.  He also has osteoarthritis of the left knee and requests cortisone injection  Procedure in detail---   The patient is brought into the operating room and positioned supine on the operating table. After successful administration of  Adductor canal block and spinal,   a tourniquet is placed high on the  Right thigh(s) and the lower extremity is prepped and draped in the usual sterile fashion. Time out is performed by the operating team and then the  Right lower extremity is wrapped in Esmarch, knee flexed and the tourniquet inflated to 300 mmHg.       A midline incision is made with a ten blade through the subcutaneous tissue to the level of the extensor mechanism. A fresh blade is used to make a medial parapatellar arthrotomy. Soft tissue over the proximal medial tibia is  subperiosteally elevated to the joint line with a knife and into the semimembranosus bursa with a Cobb elevator. Soft tissue over the proximal lateral tibia is elevated with attention being paid to avoiding the patellar tendon on the tibial tubercle. The patella is everted, knee flexed 90 degrees and the ACL and PCL are removed. Findings are bone on bone medial with large global osteophytes and exposed bone trochlea        The drill is used to create a starting hole in the distal femur and the canal is thoroughly irrigated with sterile saline to remove the fatty contents. The 5 degree Right  valgus alignment guide is placed into the femoral canal and the distal femoral cutting block is pinned to remove 10 mm off the distal femur. Resection is made with an oscillating saw.      The tibia is subluxed forward and the menisci are removed. The extramedullary alignment guide is placed referencing proximally at the medial aspect of the tibial tubercle and distally along the second metatarsal axis and tibial crest. The block is pinned to remove 2mm off the more deficient medial  side. Resection is made with an oscillating saw. Size 8is the most appropriate size for the tibia and the proximal tibia is prepared with the modular drill and keel punch for that size.      The femoral sizing guide is placed and size 8 is most appropriate. Rotation is marked off the epicondylar axis and confirmed by creating a rectangular flexion gap at 90 degrees. The size 8 cutting block is pinned in this rotation and the anterior, posterior  and chamfer cuts are made with the oscillating saw. The intercondylar block is then placed and that cut is made.      Trial size 8 tibial component, trial size 8 posterior stabilized femur and a 10  mm posterior stabilized rotating platform insert trial is placed. Full extension is achieved with excellent varus/valgus and anterior/posterior balance throughout full range of motion. The patella is everted  and thickness measured to be 27  mm. Free hand resection is taken to 15 mm, a 41 template is placed, lug holes are drilled, trial patella is placed, and it tracks normally. Osteophytes are removed off the posterior femur with the trial in place. All trials are removed and the cut bone surfaces prepared with pulsatile lavage. Cement is mixed and once ready for implantation, the size 8 tibial implant, size  8 posterior stabilized femoral component, and the size 41 patella are cemented in place and the patella is held with the clamp. The trial insert is placed and the knee held in full extension. The Exparel  (20 ml mixed with 60 ml saline) is injected into the extensor mechanism, posterior capsule, medial and lateral gutters and subcutaneous tissues.  All extruded cement is removed and once the cement is hard the permanent 10 mm posterior stabilized rotating platform insert is placed into the tibial tray.      The wound is copiously irrigated with saline solution and the extensor mechanism closed with # 0 Stratofix suture. The tourniquet is released for a total tourniquet time of 39  minutes. Flexion against gravity is 140 degrees and the patella tracks normally. Subcutaneous tissue is closed with 2.0 vicryl and subcuticular with running 4.0 Monocryl. The incision is cleaned and dried and steri-strips and a bulky sterile dressing are applied. The limb is placed into a knee immobilizer.      The left knee is prepped with alcohol and the joint injected with Depomedrol 80 mg (2 ml) without problems. The patient is then awakened and transported to recovery in stable condition.      Please note that a surgical assistant was a medical necessity for this procedure in order to perform it in a safe and expeditious manner. Surgical assistant was necessary to retract the ligaments and vital neurovascular structures to prevent injury to them and also necessary for proper positioning of the limb to allow for anatomic placement  of the prosthesis.   Cameron ROCKFORD Liborio Saccente, MD    05/21/2024, 9:25 AM   "

## 2024-05-22 ENCOUNTER — Other Ambulatory Visit (HOSPITAL_COMMUNITY): Payer: Self-pay

## 2024-05-22 DIAGNOSIS — M1711 Unilateral primary osteoarthritis, right knee: Secondary | ICD-10-CM | POA: Diagnosis not present

## 2024-05-22 LAB — CBC
HCT: 40.6 % (ref 39.0–52.0)
Hemoglobin: 14 g/dL (ref 13.0–17.0)
MCH: 33.2 pg (ref 26.0–34.0)
MCHC: 34.5 g/dL (ref 30.0–36.0)
MCV: 96.2 fL (ref 80.0–100.0)
Platelets: 168 K/uL (ref 150–400)
RBC: 4.22 MIL/uL (ref 4.22–5.81)
RDW: 12.3 % (ref 11.5–15.5)
WBC: 16.1 K/uL — ABNORMAL HIGH (ref 4.0–10.5)
nRBC: 0 % (ref 0.0–0.2)

## 2024-05-22 LAB — BASIC METABOLIC PANEL WITH GFR
Anion gap: 7 (ref 5–15)
BUN: 25 mg/dL — ABNORMAL HIGH (ref 8–23)
CO2: 23 mmol/L (ref 22–32)
Calcium: 8.6 mg/dL — ABNORMAL LOW (ref 8.9–10.3)
Chloride: 106 mmol/L (ref 98–111)
Creatinine, Ser: 1.21 mg/dL (ref 0.61–1.24)
GFR, Estimated: >60 mL/min
Glucose, Bld: 134 mg/dL — ABNORMAL HIGH (ref 70–99)
Potassium: 4.7 mmol/L (ref 3.5–5.1)
Sodium: 136 mmol/L (ref 135–145)

## 2024-05-22 MED ORDER — ASPIRIN 81 MG PO CHEW
81.0000 mg | CHEWABLE_TABLET | Freq: Two times a day (BID) | ORAL | 0 refills | Status: AC
  Filled 2024-05-22: qty 42, 21d supply, fill #0

## 2024-05-22 MED ORDER — TRAMADOL HCL 50 MG PO TABS
50.0000 mg | ORAL_TABLET | Freq: Four times a day (QID) | ORAL | 0 refills | Status: AC | PRN
  Filled 2024-05-22: qty 40, 5d supply, fill #0

## 2024-05-22 MED ORDER — METHOCARBAMOL 500 MG PO TABS
500.0000 mg | ORAL_TABLET | Freq: Four times a day (QID) | ORAL | 0 refills | Status: AC | PRN
  Filled 2024-05-22: qty 40, 10d supply, fill #0

## 2024-05-22 MED ORDER — OXYCODONE HCL 5 MG PO TABS
5.0000 mg | ORAL_TABLET | ORAL | 0 refills | Status: AC | PRN
  Filled 2024-05-22: qty 42, 7d supply, fill #0

## 2024-05-22 MED ORDER — ONDANSETRON HCL 4 MG PO TABS
4.0000 mg | ORAL_TABLET | Freq: Four times a day (QID) | ORAL | 0 refills | Status: AC | PRN
  Filled 2024-05-22: qty 20, 5d supply, fill #0

## 2024-05-22 NOTE — Progress Notes (Signed)
 Physical Therapy Treatment Patient Details Name: Cameron Curry MRN: [id number] DOB: 07/05/1950 Today's Date: 05/22/2024   History of Present Illness 74 yo male s/p R TKA on 05/21/24. PMH: arthritis, afib,  bil LE weakness, DDD, HTN, LBP, R ankle ORIF, ALIF L2-5 2021, umbilical hernia repair.    PT Comments  Pt progressing however reports R knee buckling/decr quad activation after amb ~ 40'. Pt able to continue additional 20' with heavy reliance on RW. Reviewed application of KI with pt and family. Pt has ramp at home however would like to practice stairs for 3 step entry; will see again in pm and pt should be ready to d/c after 2nd session.     If plan is discharge home, recommend the following: A little help with walking and/or transfers;A little help with bathing/dressing/bathroom;Assistance with cooking/housework;Help with stairs or ramp for entrance;Assist for transportation   Can travel by private vehicle        Equipment Recommendations  Rolling walker (2 wheels) (adjusted by PT)    Recommendations for Other Services       Precautions / Restrictions Precautions Precautions: Fall;Knee Recall of Precautions/Restrictions: Intact Precaution/Restrictions Comments: reviewed importance of terminal knee extension Knee Immobilizer - Right: Discontinue once straight leg raise with < 10 degree lag Restrictions Weight Bearing Restrictions Per Provider Order: No RLE Weight Bearing Per Provider Order: Weight bearing as tolerated     Mobility  Bed Mobility Overal bed mobility: Needs Assistance Bed Mobility: Supine to Sit, Sit to Supine     Supine to sit: Supervision Sit to supine: Supervision   General bed mobility comments: for safety    Transfers   Equipment used: Rolling walker (2 wheels) Transfers: Sit to/from Stand Sit to Stand: Contact guard assist, Supervision           General transfer comment: cues for hand placement and RLE Position. 2 attempts to stand, LOB on  1st attempt    Ambulation/Gait Ambulation/Gait assistance: Contact guard assist Gait Distance (Feet): 70 Feet Assistive device: Rolling walker (2 wheels) Gait Pattern/deviations: Step-to pattern, Antalgic       General Gait Details: cues for sequence, RW position and use of UEs to offload RLE in stance d/t pain and pt report of knee buckling after ~ 40'   Stairs             Wheelchair Mobility     Tilt Bed    Modified Rankin (Stroke Patients Only)       Balance                                            Communication Communication Communication: Impaired Factors Affecting Communication: Hearing impaired  Cognition Arousal: Alert Behavior During Therapy: WFL for tasks assessed/performed   PT - Cognitive impairments: No apparent impairments                         Following commands: Intact      Cueing Cueing Techniques: Verbal cues  Exercises Total Joint Exercises Ankle Circles/Pumps: AROM, Both, 5 reps Quad Sets: AROM, Strengthening, Both, 10 reps Heel Slides: AAROM, Right, 10 reps Straight Leg Raises: AROM, AAROM, Strengthening, Right, 10 reps Long Arc Quad: AROM, Strengthening, Right, 5 reps, Seated    General Comments        Pertinent Vitals/Pain Pain Assessment Pain Assessment: 0-10 Pain Score: 5  Pain Location: right knee Pain Descriptors / Indicators: Burning, Sore Pain Intervention(s): Limited activity within patient's tolerance, Monitored during session, Repositioned, Premedicated before session, Ice applied    Home Living                          Prior Function            PT Goals (current goals can now be found in the care plan section) Acute Rehab PT Goals Patient Stated Goal: get better and have other knee done PT Goal Formulation: With patient Time For Goal Achievement: 05/28/24 Potential to Achieve Goals: Good Progress towards PT goals: Progressing toward goals    Frequency     7X/week      PT Plan      Co-evaluation              AM-PAC PT 6 Clicks Mobility   Outcome Measure  Help needed turning from your back to your side while in a flat bed without using bedrails?: A Little Help needed moving from lying on your back to sitting on the side of a flat bed without using bedrails?: A Little Help needed moving to and from a bed to a chair (including a wheelchair)?: A Little Help needed standing up from a chair using your arms (e.g., wheelchair or bedside chair)?: A Little Help needed to walk in hospital room?: A Little Help needed climbing 3-5 steps with a railing? : A Little 6 Click Score: 18    End of Session Equipment Utilized During Treatment: Gait belt Activity Tolerance: Patient tolerated treatment well Patient left: in bed;with call bell/phone within reach;with bed alarm set Nurse Communication: Mobility status PT Visit Diagnosis: Other abnormalities of gait and mobility (R26.89)     Time: 8863-8842 PT Time Calculation (min) (ACUTE ONLY): 21 min  Charges:    $Gait Training: 8-22 mins PT General Charges $$ ACUTE PT VISIT: 1 Visit                     Brittney Mucha, PT  Acute Rehab Dept [facility]) [phone]  05/22/2024    [name] 05/22/2024, 12:22 PM

## 2024-05-22 NOTE — Progress Notes (Signed)
" ° °  Subjective: 1 Day Post-Op Procedures (LRB): ARTHROPLASTY, KNEE, TOTAL (Right) Patient reports pain as mild.   Patient seen in rounds by Dr. Melodi. Patient is well, and has had no acute complaints or problems No issues overnight. Denies chest pain, SOB, or calf pain. Foley catheter removed this AM.  We will continue therapy today, ambulated 50' yesterday.   Objective: Vital signs in last 24 hours: Temp:  [97.5 F (36.4 C)-98.1 F (36.7 C)] 97.7 F (36.5 C) (01/13 0549) Pulse Rate:  [54-90] 68 (01/13 0549) Resp:  [16-28] 18 (01/13 0549) BP: (103-169)/(63-90) 144/79 (01/13 0549) SpO2:  [93 %-98 %] 95 % (01/13 0549)  Intake/Output from previous day:  Intake/Output Summary (Last 24 hours) at 05/22/2024 0811 Last data filed at 05/22/2024 0600 Gross per 24 hour  Intake 3870.42 ml  Output 2650 ml  Net 1220.42 ml     Intake/Output this shift: No intake/output data recorded.  Labs: Recent Labs    05/22/24 0347  HGB 14.0   Recent Labs    05/22/24 0347  WBC 16.1*  RBC 4.22  HCT 40.6  PLT 168   Recent Labs    05/22/24 0347  NA 136  K 4.7  CL 106  CO2 23  BUN 25*  CREATININE 1.21  GLUCOSE 134*  CALCIUM  8.6*   No results for input(s): LABPT, INR in the last 72 hours.  Exam: General - Patient is Alert and Oriented Extremity - Neurologically intact Neurovascular intact Sensation intact distally Dorsiflexion/Plantar flexion intact Dressing - dressing C/D/I Motor Function - intact, moving foot and toes well on exam.   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    Peripheral nerve disease     Assessment/Plan: 1 Day Post-Op Procedures (LRB): ARTHROPLASTY, KNEE, TOTAL (Right) Principal Problem:   OA (osteoarthritis) of knee Active Problems:   Primary osteoarthritis of right knee  Estimated body mass index is 34.87 kg/m as calculated from the following:   Height as of this encounter: 5' 11 (1.803 m).   Weight as of this encounter: 113.4 kg. Advance diet Up with therapy D/C IV fluids   Patient's anticipated LOS is less than 2 midnights, meeting these requirements: - Has a competent adult at home to recover with post-op recover - NO history of  - Chronic pain requiring opiods  - Diabetes  - Coronary Artery Disease  - Heart failure  - Heart attack  - Stroke  - DVT/VTE  - Respiratory Failure/COPD  - Renal failure  - Anemia  - Advanced Liver disease   DVT Prophylaxis - Aspirin  Weight bearing as tolerated. Continue therapy.  Plan is to go Home after hospital stay. Plan for discharge later today if progresses with therapy and meeting goals. Scheduled for OPPT at [facility] [place]. Follow-up in the office in 2 weeks.  The PDMP database was reviewed today prior to any opioid medications being prescribed to this patient.  Waddell Sor, PA-C Orthopedic Surgery [phone] 05/22/2024, 8:11 AM  "

## 2024-05-22 NOTE — Discharge Summary (Signed)
 Patient ID: Cameron Curry MRN: [id number] DOB/AGE: Jun 13, 1950 74 y.o.  Admit date: 05/21/2024 Discharge date: 05/22/2024  Admission Diagnoses:  Principal Problem:   OA (osteoarthritis) of knee Active Problems:   Primary osteoarthritis of right knee   Discharge Diagnoses:  Same  Past Medical History:  Diagnosis Date   Arthritis    Atrial fibrillation [facility])    ablation   Bilateral leg weakness    DDD (degenerative disc disease), lumbar    Degenerative scoliosis    Dysrhythmia    atrial fibrillation   Hypertensive disorder    Hypothyroidism    Irregular heartbeat    Joint pain    Low back pain    Lumbar radiculopathy    Neurogenic claudication due to lumbar spinal stenosis 01/16/2018   Of lumbar region.   Peripheral nerve disease     Surgeries: Procedures: ARTHROPLASTY, KNEE, TOTAL on 05/21/2024   Consultants:   Discharged Condition: Improved  Hospital Course: Bhavin Monjaraz is an 74 y.o. male who was admitted 05/21/2024 for operative treatment ofOA (osteoarthritis) of knee. Patient has severe unremitting pain that affects sleep, daily activities, and work/hobbies. After pre-op clearance the patient was taken to the operating room on 05/21/2024 and underwent  Procedures: ARTHROPLASTY, KNEE, TOTAL.    Patient was given perioperative antibiotics:  Anti-infectives (From admission, onward)    Start     Dose/Rate Route Frequency Ordered Stop   05/21/24 1430  ceFAZolin  (ANCEF ) IVPB 2g/100 mL premix        2 g 200 mL/hr over 30 Minutes Intravenous Every 6 hours 05/21/24 1122 05/22/24 0700   05/21/24 0600  ceFAZolin  (ANCEF ) IVPB 2g/100 mL premix        2 g 200 mL/hr over 30 Minutes Intravenous On call to O.R. 05/21/24 0536 05/21/24 0827        Patient was given sequential compression devices, early ambulation, and chemoprophylaxis to prevent DVT.  Patient benefited maximally from hospital stay and there were no complications.    Recent vital signs: Patient Vitals  for the past 24 hrs:  BP Temp Temp src Pulse Resp SpO2  05/22/24 0932 119/75 (!) 97.5 F (36.4 C) Oral 70 16 95 %  05/22/24 0549 (!) 144/79 97.7 F (36.5 C) Oral 68 18 95 %  05/22/24 0048 137/77 98 F (36.7 C) Oral 82 18 96 %  05/21/24 2142 (!) 169/90 97.9 F (36.6 C) Oral 64 16 96 %     Recent laboratory studies:  Recent Labs    05/22/24 0347  WBC 16.1*  HGB 14.0  HCT 40.6  PLT 168  NA 136  K 4.7  CL 106  CO2 23  BUN 25*  CREATININE 1.21  GLUCOSE 134*  CALCIUM  8.6*     Discharge Medications:   Allergies as of 05/22/2024   No Known Allergies      Medication List     STOP taking these medications    aspirin  EC 81 MG tablet Replaced by: Aspirin  Low Dose 81 MG chewable tablet       TAKE these medications    Aspirin  Low Dose 81 MG chewable tablet Generic drug: aspirin  Chew 1 tablet (81 mg total) by mouth 2 (two) times daily for 21 days. Then resume one 81 mg aspirin  once a day Replaces: aspirin  EC 81 MG tablet   levothyroxine  25 MCG tablet Commonly known as: SYNTHROID  Take 25 mcg by mouth daily before breakfast.   lisinopril  5 MG tablet Commonly known as: ZESTRIL  Take 5 mg by mouth  daily.   methocarbamol  500 MG tablet Commonly known as: ROBAXIN  Take 1 tablet (500 mg total) by mouth every 6 (six) hours as needed for muscle spasms.   metoprolol  succinate 25 MG 24 hr tablet Commonly known as: TOPROL -XL Take 25 mg by mouth daily.   multivitamin with minerals Tabs tablet Take 1 tablet by mouth daily.   ondansetron  4 MG tablet Commonly known as: ZOFRAN  Take 1 tablet (4 mg total) by mouth every 6 (six) hours as needed for nausea.   oxyCODONE  5 MG immediate release tablet Commonly known as: Oxy IR/ROXICODONE  Take 1 tablet (5 mg total) by mouth every 4 (four) hours as needed for severe pain (pain score 7-10).   traMADol  50 MG tablet Commonly known as: ULTRAM  Take 1-2 tablets (50-100 mg total) by mouth every 6 (six) hours as needed for moderate  pain (pain score 4-6).               Discharge Care Instructions  (From admission, onward)           Start     Ordered   05/22/24 0000  Weight bearing as tolerated        05/22/24 0818   05/22/24 0000  Change dressing       Comments: You may remove the bulky bandage (ACE wrap and gauze) two days after surgery. You will have an adhesive waterproof bandage underneath. Leave this in place until your first follow-up appointment.   05/22/24 0818            Diagnostic Studies: No results found.  Disposition: Discharge disposition: 01-Home or Self Care       Discharge Instructions     Call MD / Call 911   Complete by: As directed    If you experience chest pain or shortness of breath, CALL 911 and be transported to the hospital emergency room.  If you develope a fever above 101 F, pus (white drainage) or increased drainage or redness at the wound, or calf pain, call your surgeon's office.   Change dressing   Complete by: As directed    You may remove the bulky bandage (ACE wrap and gauze) two days after surgery. You will have an adhesive waterproof bandage underneath. Leave this in place until your first follow-up appointment.   Constipation Prevention   Complete by: As directed    Drink plenty of fluids.  Prune juice may be helpful.  You may use a stool softener, such as Colace (over the counter) 100 mg twice a day.  Use MiraLax  (over the counter) for constipation as needed.   Diet - low sodium heart healthy   Complete by: As directed    Do not put a pillow under the knee. Place it under the heel.   Complete by: As directed    Driving restrictions   Complete by: As directed    No driving for two weeks   Post-operative opioid taper instructions:   Complete by: As directed    POST-OPERATIVE OPIOID TAPER INSTRUCTIONS: It is important to wean off of your opioid medication as soon as possible. If you do not need pain medication after your surgery it is ok to stop day  one. Opioids include: Codeine, Hydrocodone(Norco, Vicodin), Oxycodone (Percocet, oxycontin ) and hydromorphone  amongst others.  Long term and even short term use of opiods can cause: Increased pain response Dependence Constipation Depression Respiratory depression And more.  Withdrawal symptoms can include Flu like symptoms Nausea, vomiting And more Techniques to manage these  symptoms Hydrate well Eat regular healthy meals Stay active Use relaxation techniques(deep breathing, meditating, yoga) Do Not substitute Alcohol to help with tapering If you have been on opioids for less than two weeks and do not have pain than it is ok to stop all together.  Plan to wean off of opioids This plan should start within one week post op of your joint replacement. Maintain the same interval or time between taking each dose and first decrease the dose.  Cut the total daily intake of opioids by one tablet each day Next start to increase the time between doses. The last dose that should be eliminated is the evening dose.      TED hose   Complete by: As directed    Use stockings (TED hose) for three weeks on both leg(s).  You may remove them at night for sleeping.   Weight bearing as tolerated   Complete by: As directed         Follow-up Information     Aluisio, Dempsey, MD. Schedule an appointment as soon as possible for a visit in 2 week(s).   Specialty: Orthopedic Surgery Contact information: [address] [place] [address] [place] [address] [phone]         Neysa Bruckner, FNP Follow up.   Specialty: Nurse Practitioner Why: Hospital follow up appointment:   Tuesday, January 27,2026 11:00am   please call within 24 hours of your appointment to reschedule or cancel Contact information: [address] [place] [address] [phone]                  Signed: Waddell DELENA Sor 05/22/2024, 7:17 PM

## 2024-05-22 NOTE — Progress Notes (Signed)
 Discharge meds in a secure bag delivered to patient by this RN

## 2024-05-22 NOTE — Progress Notes (Signed)
 PT TX NOTE.   05/22/24 1300  PT Visit Information  Last PT Received On 05/22/24  Assistance Needed Pt meeting goals and is ready to d/c from PT standpoint with family/friends assisting prn. Pt cautioned regarding not moving too quickly, reviewed gait/transfer/RW safety for fall prevention. Pt is at risk for falls however very motivated to d/c today. We reviewed stairs with rail and crutch but did caution pt against going up/down stairs to his basement until further review with OPPT. Pt caregiver fully in support of this recommendation.pt does have a ramp for home entry (and also a scooter) however states he will likely go up the 3 steps.   History of Present Illness 74 yo male s/p R TKA on 05/21/24. PMH: arthritis, afib,  bil LE weakness, DDD, HTN, LBP, R ankle ORIF, ALIF L2-5 2021, umbilical hernia repair.  Subjective Data  Patient Stated Goal get better and have other knee done  Precautions  Precautions Fall;Knee  Recall of Precautions/Restrictions Intact  Precaution/Restrictions Comments reviewed importance of terminal knee extension  Knee Immobilizer - Right Discontinue once straight leg raise with < 10 degree lag  Restrictions  Weight Bearing Restrictions Per Provider Order No  RLE Weight Bearing Per Provider Order WBAT  Pain Assessment  Pain Location right knee  Pain Descriptors / Indicators Burning;Sore  Cognition  Arousal Alert  Behavior During Therapy WFL for tasks assessed/performed  PT - Cognitive impairments No apparent impairments  Following Commands  Following commands Intact  Cueing  Cueing Techniques Verbal cues  Communication  Communication Impaired  Factors Affecting Communication Hearing impaired  Bed Mobility  Overal bed mobility Needs Assistance  Bed Mobility Supine to Sit;Sit to Supine  Supine to sit Supervision  Sit to supine Supervision  General bed mobility comments for safety  Transfers  Equipment used Rolling walker (2 wheels)  Transfers Sit to/from  Stand  Sit to Stand Contact guard assist  General transfer comment cues for hand placement and RLE Position. no LOB with cues to slow speed of movement  Ambulation/Gait  Ambulation/Gait assistance Contact guard assist  Gait Distance (Feet) 100 Feet  Assistive device Rolling walker (2 wheels)  Gait Pattern/deviations Step-to pattern;Decreased stance time - right  General Gait Details cues for sequence, RW position, and overall safety  Gait velocity decr  Stairs Yes  Stairs assistance Min assist;Contact guard assist  Stair Management One rail Right;Step to pattern;With crutches;Forwards  Number of Stairs 2 (up 2 and down 3)  General stair comments cues for proper sequence, crutch placement and to slow speed of movement. assist to steady. no overt LOB or knee buckling; reviewed techniques with pt caregiver  Total Joint Exercises  Ankle Circles/Pumps AROM;Both;5 reps  PT - End of Session  Equipment Utilized During Treatment Gait belt  Activity Tolerance Patient tolerated treatment well  Patient left in bed;with call bell/phone within reach;with bed alarm set  Nurse Communication Mobility status   PT - Assessment/Plan  PT Visit Diagnosis Other abnormalities of gait and mobility (R26.89)  PT Frequency (ACUTE ONLY) 7X/week  Follow Up Recommendations Follow physician's recommendations for discharge plan and follow up therapies  Patient can return home with the following A little help with walking and/or transfers;A little help with bathing/dressing/bathroom;Assistance with cooking/housework;Help with stairs or ramp for entrance;Assist for transportation  PT equipment Rolling walker (2 wheels) (adjusted by PT)  AM-PAC PT 6 Clicks Mobility Outcome Measure (Version 2)  Help needed turning from your back to your side while in a flat bed without using  bedrails? 3  Help needed moving from lying on your back to sitting on the side of a flat bed without using bedrails? 3  Help needed moving to and  from a bed to a chair (including a wheelchair)? 3  Help needed standing up from a chair using your arms (e.g., wheelchair or bedside chair)? 3  Help needed to walk in hospital room? 3  Help needed climbing 3-5 steps with a railing?  3  6 Click Score 18  Consider Recommendation of Discharge To: Home with [facility]  PT Goal Progression  Progress towards PT goals Progressing toward goals  Acute Rehab PT Goals  PT Goal Formulation With patient  Time For Goal Achievement 05/28/24  Potential to Achieve Goals Good  PT Time Calculation  PT Start Time (ACUTE ONLY) 1301  PT Stop Time (ACUTE ONLY) 1315  PT Time Calculation (min) (ACUTE ONLY) 14 min  PT General Charges  $$ ACUTE PT VISIT 1 Visit  PT Treatments  $Gait Training 8-22 mins

## 2024-05-22 NOTE — TOC Transition Note (Addendum)
 Transition of Care [facility]) - Discharge Note   Patient Details  Name: Cameron Curry MRN: [id number] Date of Birth: [birth year]  [facility] CM/SW Contact:  Alfonse JONELLE Rex, RN Phone Number: 05/22/2024, 10:05 AM   Clinical Narrative:   Met with patient at bedside to review dc therapy and home equipment needs, pt confirmed OPPT- [facility] [place] RW delivered to bedside by [employer]. MOON completed.   -11:441am Hospital PCP appt scheduled with Dr C. Neysa, MD at [facility] , Tuesday, January 27.2026 at 11:00am, added to AVS.     Final next level of care: OP Rehab Barriers to Discharge: No Barriers Identified   Patient Goals and CMS Choice Patient states their goals for this hospitalization and ongoing recovery are:: return home          Discharge Placement                       Discharge Plan and Services Additional resources added to the After Visit Summary for                                       Social Drivers of Health (SDOH) Interventions SDOH Screenings   Food Insecurity: No Food Insecurity (05/21/2024)  Housing: Low Risk (05/21/2024)  Transportation Needs: No Transportation Needs (05/21/2024)  Utilities: Not At Risk (05/21/2024)  Financial Resource Strain: Low Risk (08/12/2022)   Received from [facility]  Social Connections: Unknown (05/21/2024)  Tobacco Use: Low Risk (05/21/2024)  Recent Concern: Tobacco Use - Medium Risk (04/04/2024)   Received from [facility]     Readmission Risk Interventions     No data to display

## 2024-05-22 NOTE — Care Management Obs Status (Signed)
 MEDICARE OBSERVATION STATUS NOTIFICATION   Patient Details  Name: Cameron Curry MRN: [id number] Date of Birth: 12-19-1950   Medicare Observation Status Notification Given:  Yes    Alfonse JONELLE Rex, RN 05/22/2024, 9:30 AM

## 2024-05-23 ENCOUNTER — Encounter (HOSPITAL_COMMUNITY): Payer: Self-pay | Admitting: Orthopedic Surgery
